# Patient Record
Sex: Female | Born: 1990 | Race: White | Hispanic: No | Marital: Married | Smoking: Former smoker
Health system: Southern US, Community
[De-identification: ages and names within clinical notes are randomized; demographics above are authoritative.]

## PROBLEM LIST (undated history)

## (undated) DIAGNOSIS — B999 Unspecified infectious disease: Secondary | ICD-10-CM

## (undated) DIAGNOSIS — O1490 Unspecified pre-eclampsia, unspecified trimester: Secondary | ICD-10-CM

## (undated) DIAGNOSIS — N83209 Unspecified ovarian cyst, unspecified side: Secondary | ICD-10-CM

## (undated) DIAGNOSIS — T8859XA Other complications of anesthesia, initial encounter: Secondary | ICD-10-CM

## (undated) DIAGNOSIS — Z8489 Family history of other specified conditions: Secondary | ICD-10-CM

## (undated) DIAGNOSIS — O009 Unspecified ectopic pregnancy without intrauterine pregnancy: Secondary | ICD-10-CM

## (undated) DIAGNOSIS — T4145XA Adverse effect of unspecified anesthetic, initial encounter: Secondary | ICD-10-CM

## (undated) HISTORY — PX: LAPAROSCOPY: SHX197

## (undated) HISTORY — PX: KNEE SURGERY: SHX244

## (undated) HISTORY — PX: WISDOM TOOTH EXTRACTION: SHX21

## (undated) HISTORY — PX: HERNIA REPAIR: SHX51

## (undated) HISTORY — DX: Unspecified pre-eclampsia, unspecified trimester: O14.90

---
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
## 2012-11-13 ENCOUNTER — Emergency Department: Payer: Self-pay | Admitting: Emergency Medicine

## 2012-11-13 LAB — COMPREHENSIVE METABOLIC PANEL
Albumin: 3.6 g/dL — ABNORMAL LOW (ref 3.8–5.6)
Alkaline Phosphatase: 105 U/L (ref 82–169)
Anion Gap: 6 — ABNORMAL LOW (ref 7–16)
BUN: 6 mg/dL — ABNORMAL LOW (ref 7–18)
Bilirubin,Total: 0.5 mg/dL (ref 0.2–1.0)
Creatinine: 0.82 mg/dL (ref 0.60–1.30)
EGFR (African American): >60
EGFR (Non-African Amer.): >60
Glucose: 97 mg/dL (ref 65–99)
Osmolality: 269 (ref 275–301)
Potassium: 3.3 mmol/L — ABNORMAL LOW (ref 3.5–5.1)
SGOT(AST): 12 U/L (ref 0–26)
SGPT (ALT): 15 U/L (ref 12–78)

## 2012-11-13 LAB — CBC
HGB: 11.9 g/dL — ABNORMAL LOW (ref 12.0–16.0)
MCH: 29.5 pg (ref 26.0–34.0)
MCHC: 34.1 g/dL (ref 32.0–36.0)
Platelet: 269 10*3/uL (ref 150–440)
RDW: 11.7 % (ref 11.5–14.5)
WBC: 14.4 10*3/uL — ABNORMAL HIGH (ref 3.6–11.0)

## 2012-11-13 LAB — URINALYSIS, COMPLETE
Bilirubin,UR: NEGATIVE
Blood: NEGATIVE
Glucose,UR: NEGATIVE mg/dL (ref 0–75)
Nitrite: NEGATIVE
Ph: 5 (ref 4.5–8.0)
Protein: 30
RBC,UR: 4 /HPF (ref 0–5)
Squamous Epithelial: 8

## 2013-04-24 ENCOUNTER — Ambulatory Visit: Payer: Self-pay | Admitting: Obstetrics and Gynecology

## 2013-04-24 LAB — URINALYSIS, COMPLETE
Bacteria: NEGATIVE
Bilirubin,UR: NEGATIVE
Blood: NEGATIVE
Glucose,UR: NEGATIVE mg/dL (ref 0–75)
Leukocyte Esterase: NEGATIVE
Nitrite: NEGATIVE
Protein: NEGATIVE
Specific Gravity: 1.005 (ref 1.003–1.030)

## 2013-04-24 LAB — CBC
HGB: 14.3 g/dL (ref 12.0–16.0)
MCH: 30.3 pg (ref 26.0–34.0)
MCV: 87 fL (ref 80–100)
Platelet: 212 10*3/uL (ref 150–440)
RBC: 4.73 10*6/uL (ref 3.80–5.20)
WBC: 6.4 10*3/uL (ref 3.6–11.0)

## 2013-05-03 ENCOUNTER — Ambulatory Visit: Payer: Self-pay | Admitting: Obstetrics and Gynecology

## 2013-05-06 LAB — PATHOLOGY REPORT

## 2014-06-02 ENCOUNTER — Ambulatory Visit (INDEPENDENT_AMBULATORY_CARE_PROVIDER_SITE_OTHER): Payer: BC Managed Care – PPO | Admitting: Emergency Medicine

## 2014-06-02 VITALS — BP 124/86 | HR 105 | Temp 97.9°F | Resp 16 | Ht 64.0 in | Wt 124.4 lb

## 2014-06-02 DIAGNOSIS — J209 Acute bronchitis, unspecified: Secondary | ICD-10-CM

## 2014-06-02 DIAGNOSIS — J018 Other acute sinusitis: Secondary | ICD-10-CM

## 2014-06-02 MED ORDER — PSEUDOEPHEDRINE-GUAIFENESIN ER 60-600 MG PO TB12: 1.0000 | ORAL_TABLET | Freq: Two times a day (BID) | ORAL | Status: AC

## 2014-06-02 MED ORDER — AMOXICILLIN-POT CLAVULANATE 875-125 MG PO TABS: 1.0000 | ORAL_TABLET | Freq: Two times a day (BID) | ORAL | Status: DC

## 2014-06-02 MED ORDER — PROMETHAZINE-CODEINE 6.25-10 MG/5ML PO SYRP
5.0000 mL | ORAL_SOLUTION | Freq: Four times a day (QID) | ORAL | Status: DC | PRN
Start: 2014-06-02 — End: 2015-09-29

## 2014-06-02 NOTE — Patient Instructions (Signed)

## 2014-06-02 NOTE — Progress Notes (Signed)
[facility] [address], [place] [id number] [phone]  Date:  06/02/2014   Name:  [name] [name]   DOB:  1990/11/05   MRN:  [id number]  PCP:  No PCP Per Patient    Chief Complaint: Cough, Back Pain and Chest Congestion   History of Present Illness:  [name] [name] is a 23 y.o. very pleasant female patient who presents with the following:  Ill since the weekend with nasal congestion and post nasal drainage Has a cough productive of purulent sputum.  No wheezing or shortness of breath. No nausea or vomiting.  No stool change or rash No fever but is chilled.   Worse now than weekend Works in day care No improvement with over the counter medications or other home remedies.  Denies other complaint or health concern today.    There are no active problems to display for this patient.   History reviewed. No pertinent past medical history.  History reviewed. No pertinent past surgical history.  History  Substance Use Topics  . Smoking status: Current Every Day Smoker -- 0.50 packs/day    Types: Cigarettes  . Smokeless tobacco: Never Used  . Alcohol Use: 0.5 oz/week    1 drink(s) per week    Family History  Problem Relation Age of Onset  . Cancer Maternal Grandmother   . Diabetes Maternal Grandfather   . Heart disease Paternal Grandmother   . Hyperlipidemia Paternal Grandmother   . Stroke Paternal Grandmother   . Heart disease Paternal Grandfather   . Hyperlipidemia Paternal Grandfather     No Known Allergies  Medication list has been reviewed and updated.  No current outpatient prescriptions on file prior to visit.   No current facility-administered medications on file prior to visit.    Review of Systems:  As per HPI, otherwise negative.    Physical Examination: Filed Vitals:   06/02/14 1921  BP: 124/86  Pulse: 105  Temp: 97.9 F (36.6 C)  Resp: 16   Filed Vitals:   06/02/14 1921  Height: 5\' 4"  (1.626 m)  Weight: 124 lb  6 oz (56.416 kg)   Body mass index is 21.34 kg/(m^2). Ideal Body Weight: Weight in (lb) to have BMI = 25: 145.3  GEN: WDWN, NAD, Non-toxic, A & O x 3 HEENT: Atraumatic, Normocephalic. Neck supple. No masses, No LAD. Ears and Nose: No external deformity. CV: RRR, No M/G/R. No JVD. No thrill. No extra heart sounds. PULM: CTA B, no wheezes, crackles, rhonchi. No retractions. No resp. distress. No accessory muscle use. ABD: S, NT, ND, +BS. No rebound. No HSM. EXTR: No c/c/e NEURO Normal gait.  PSYCH: Normally interactive. Conversant. Not depressed or anxious appearing.  Calm demeanor.    Assessment and Plan: Bronchitis Sinusitis Augmentin  mucinex d Phen c cod  Signed,  Phillips OdorJeffery Lex Linhares, MD

## 2014-11-28 NOTE — Op Note (Signed)
PATIENT NAME:  [name], [name] A MR#:  [id number] DATE OF BIRTH:  1990/08/31  DATE OF PROCEDURE:  05/03/2013  PREOPERATIVE DIAGNOSIS: Chronic pelvic pain and a right ovarian cyst.   POSTOPERATIVE DIAGNOSIS: Pelvic pain and endometriosis.   PROCEDURES: Operative laparoscopy, excision and cautery of endometriosis, lysis of adhesions.   ANESTHESIA: General.  SURGEON: Senaida LangeLashawn Weaver-Lee, M.D.   ESTIMATED BLOOD LOSS: Minimal.   OPERATIVE FLUIDS: 100 mL.   COMPLICATIONS: None.   FINDINGS:  1.  Endometriotic lesions in the posterior cul-de-sac, inferior to the uterosacral ligaments bilaterally, and right pelvic sidewall.  2.  Filmy adhesions of the left ovary to the mesosalpinx and left posterior uterus.  3.  Clubbing of the fimbriated ends of both tubes.  4.  Clubbed appearance of the right cornu.  5.  Filmy adhesions of the liver to anterior abdominal wall (Fitz-Hugh-Curtis).  6.  Normal appearing uterus and ovaries.   SPECIMENS: Two biopsies the posterior cul-de-sac.   INDICATIONS: The patient is a [age] year old who presents for diagnostic laparoscopy and right ovarian cystectomy for chronic pelvic pain and an ovarian cyst. Risks, benefits, indications, and alternatives of the procedure were explained and informed consent was obtained.   PROCEDURE: The patient was taken to the operating room with IV fluids running. She was prepped and draped in the usual sterile fashion in [place] stirrups. A speculum was placed inside the vagina. The anterior lip of the cervix was grasped with a single-tooth tenaculum and a Hulka tenaculum was placed for uterine manipulation. Attention was turned to her abdomen where the infraumbilical region was injected with 0.5% Sensorcaine. A 5 mm incision was made and the Veress needle was placed. The abdomen was insufflated with CO2 gas. The Veress needle was removed and the abdomen was entered under direct visualization using a 5 mm Xcel trocar. Two additional trocars  were placed in the patient's abdomen, both 5 mm. Findings were as previously stated. The endometriotic lesions in the posterior cul-de-sac were grasped with a microdissecting laparoscopic tubal and the lesion was biopsied using laparoscopic scissors. This was done twice. The remainder of the visualized endometriotic lesions were cauterized using the Kleppinger. The filmy adhesions between the left ovary and the uterus were lysed using the Kleppinger. The surgical areas from which the biopsies were taken were made hemostatic using Kleppinger cautery of the appendix was visualized and was seen to be normal. Adhesions over the liver to the anterior abdominal wall were not treated. Hemostasis was obtained. The patient tolerated the procedure well. All instrumentation was removed from her abdomen. The skin of the incisions were all closed using Dermabond skin glue. Sponge and instrument counts were correct x 2. The patient was awakened from anesthesia and taken to the recovery room in stable condition.    ____________________________ Sonda PrimesLashawn A. Patton SallesWeaver-Lee, MD law:dp D: 05/03/2013 11:11:58 ET T: 05/03/2013 11:29:42 ET JOB#: [id number]  cc: Flint MelterLashawn A. Patton SallesWeaver-Lee, MD, <Dictator> Janyth ContesLASHAWN A WEAVER LEE MD ELECTRONICALLY SIGNED 05/20/2013 18:17

## 2015-06-05 ENCOUNTER — Encounter: Payer: Self-pay | Admitting: Family Medicine

## 2015-09-29 ENCOUNTER — Ambulatory Visit (INDEPENDENT_AMBULATORY_CARE_PROVIDER_SITE_OTHER): Payer: BLUE CROSS/BLUE SHIELD | Admitting: Family Medicine

## 2015-09-29 ENCOUNTER — Encounter: Payer: Self-pay | Admitting: Family Medicine

## 2015-09-29 VITALS — BP 120/84 | HR 76 | Temp 98.2°F | Resp 14 | Wt 130.8 lb

## 2015-09-29 DIAGNOSIS — H109 Unspecified conjunctivitis: Secondary | ICD-10-CM | POA: Diagnosis not present

## 2015-09-29 DIAGNOSIS — J309 Allergic rhinitis, unspecified: Secondary | ICD-10-CM

## 2015-09-29 DIAGNOSIS — H6980 Other specified disorders of Eustachian tube, unspecified ear: Secondary | ICD-10-CM | POA: Insufficient documentation

## 2015-09-29 DIAGNOSIS — G43909 Migraine, unspecified, not intractable, without status migrainosus: Secondary | ICD-10-CM | POA: Insufficient documentation

## 2015-09-29 DIAGNOSIS — F17200 Nicotine dependence, unspecified, uncomplicated: Secondary | ICD-10-CM | POA: Insufficient documentation

## 2015-09-29 MED ORDER — ERYTHROMYCIN 5 MG/GM OP OINT: 1.0000 "application " | TOPICAL_OINTMENT | Freq: Every day | OPHTHALMIC | Status: DC

## 2015-09-29 NOTE — Patient Instructions (Signed)
Allergic Rhinitis Allergic rhinitis is when the mucous membranes in the nose respond to allergens. Allergens are particles in the air that cause your body to have an allergic reaction. This causes you to release allergic antibodies. Through a chain of events, these eventually cause you to release histamine into the blood stream. Although meant to protect the body, it is this release of histamine that causes your discomfort, such as frequent sneezing, congestion, and an itchy, runny nose.  CAUSES Seasonal allergic rhinitis (hay fever) is caused by pollen allergens that may come from grasses, trees, and weeds. Year-round allergic rhinitis (perennial allergic rhinitis) is caused by allergens such as house dust mites, pet dander, and mold spores. SYMPTOMS  Nasal stuffiness (congestion).  Itchy, runny nose with sneezing and tearing of the eyes. DIAGNOSIS Your health care provider can help you determine the allergen or allergens that trigger your symptoms. If you and your health care provider are unable to determine the allergen, skin or blood testing may be used. Your health care provider will diagnose your condition after taking your health history and performing a physical exam. Your health care provider may assess you for other related conditions, such as asthma, pink eye, or an ear infection. TREATMENT Allergic rhinitis does not have a cure, but it can be controlled by:  Medicines that block allergy symptoms. These may include allergy shots, nasal sprays, and oral antihistamines.  Avoiding the allergen. Hay fever may often be treated with antihistamines in pill or nasal spray forms. Antihistamines block the effects of histamine. There are over-the-counter medicines that may help with nasal congestion and swelling around the eyes. Check with your health care provider before taking or giving this medicine. If avoiding the allergen or the medicine prescribed do not work, there are many new medicines  your health care provider can prescribe. Stronger medicine may be used if initial measures are ineffective. Desensitizing injections can be used if medicine and avoidance does not work. Desensitization is when a patient is given ongoing shots until the body becomes less sensitive to the allergen. Make sure you follow up with your health care provider if problems continue. HOME CARE INSTRUCTIONS It is not possible to completely avoid allergens, but you can reduce your symptoms by taking steps to limit your exposure to them. It helps to know exactly what you are allergic to so that you can avoid your specific triggers. SEEK MEDICAL CARE IF:  You have a fever.  You develop a cough that does not stop easily (persistent).  You have shortness of breath.  You start wheezing.  Symptoms interfere with normal daily activities.   This information is not intended to replace advice given to you by your health care provider. Make sure you discuss any questions you have with your health care provider.   Document Released: 04/19/2001 Document Revised: 08/15/2014 Document Reviewed: 04/01/2013 Elsevier Interactive Patient Education 2016 [employer]. Allergic Conjunctivitis Allergic conjunctivitis is inflammation of the clear membrane that covers the white part of your eye and the inner surface of your eyelid (conjunctiva), and it is caused by allergies. The blood vessels in the conjunctiva become inflamed, and this causes the eye to become red or pink, and it often causes itchiness in the eye. Allergic conjunctivitis cannot be spread by one person to another person (noncontagious). CAUSES This condition is caused by an allergic reaction. Common causes of an allergic reaction (allergens) include:  Dust.  Pollen.  Mold.  Animal dander or secretions. RISK FACTORS This condition is  more likely to develop if you are exposed to high levels of allergens that cause the allergic reaction. This might include  being outdoors when air pollen levels are high or being around animals that you are allergic to. SYMPTOMS Symptoms of this condition may include:  Eye redness.  Tearing of the eyes.  Watery eyes.  Itchy eyes.  Burning feeling in the eyes.  Clear drainage from the eyes.  Swollen eyelids. DIAGNOSIS This condition may be diagnosed by medical history and physical exam. If you have drainage from your eyes, it may be tested to rule out other causes of conjunctivitis. TREATMENT Treatment for this condition often includes medicines. These may be eye drops, ointments, or oral medicines. They may be prescription medicines or over-the-counter medicines. HOME CARE INSTRUCTIONS  Take or apply medicines only as directed by your health care provider.  Do not touch or rub your eyes.  Do not wear contact lenses until the inflammation is gone. Wear glasses instead.  Do not wear eye makeup until the inflammation is gone.  Apply a cool, clean washcloth to your eye for 10-20 minutes, 3-4 times a day.  Try to avoid whatever allergen is causing the allergic reaction. SEEK MEDICAL CARE IF:  Your symptoms get worse.  You have pus draining from your eye.  You have new symptoms.  You have a fever.   This information is not intended to replace advice given to you by your health care provider. Make sure you discuss any questions you have with your health care provider.   Document Released: 10/15/2002 Document Revised: 08/15/2014 Document Reviewed: 05/06/2014 Elsevier Interactive Patient Education [employer]

## 2015-09-29 NOTE — Progress Notes (Signed)
Patient ID: [name] [name], female   DOB: [birth year], 25 y.o.   MRN: [id number]   [name] Female    DOB: 1990/10/02   24 y.o.   MRN: [id number] Visit Date: 09/29/2015  Today's Provider: Dortha Kern, PA   Chief Complaint  Patient presents with  . Conjunctivitis   Subjective:    Conjunctivitis  The current episode started 2 days ago. The onset was sudden. Associated symptoms include rhinorrhea, eye discharge, eye pain (eye burning) and eye redness. Pertinent negatives include no congestion and no sore throat. Associated symptoms comments: Right eye. The eye pain is moderate. The right eye is affected. The eyelid exhibits swelling.   History reviewed. No pertinent past medical history. Patient Active Problem List   Diagnosis Date Noted  . Dysfunction of eustachian tube 09/29/2015  . Compulsive tobacco user syndrome 09/29/2015  . Headache, migraine 09/29/2015   Past Surgical History  Procedure Laterality Date  . Hernia repair    . Wisdom tooth extraction    . Laparoscopy    . Knee surgery Right    Family History  Problem Relation Age of Onset  . Cancer Maternal Grandmother   . Diabetes Maternal Grandfather   . Heart disease Paternal Grandmother   . Hyperlipidemia Paternal Grandmother   . Stroke Paternal Grandmother   . Heart disease Paternal Grandfather   . Hyperlipidemia Paternal Grandfather   . COPD Paternal Grandfather   . Healthy Mother   . Healthy Father      Previous Medications   No medications on file   No Known Allergies  Review of Systems  Constitutional: Negative.   HENT: Positive for rhinorrhea. Negative for congestion and sore throat.   Eyes: Positive for pain (eye burning), discharge and redness.  Respiratory: Negative.   Cardiovascular: Negative.   Gastrointestinal: Negative.   Endocrine: Negative.   Genitourinary: Negative.   Musculoskeletal: Negative.   Skin: Negative.   Allergic/Immunologic: Negative.   Neurological: Negative.     Hematological: Negative.   Psychiatric/Behavioral: Negative.     Social History  Substance Use Topics  . Smoking status: Current Every Day Smoker -- 0.50 packs/day    Types: Cigarettes  . Smokeless tobacco: Never Used  . Alcohol Use: 0.5 oz/week    1 Standard drinks or equivalent per week   Objective:   BP 120/84 mmHg  Pulse 76  Temp(Src) 98.2 F (36.8 C) (Oral)  Resp 14  Wt 130 lb 12.8 oz (59.33 kg)  LMP 09/23/2015  Physical Exam  Constitutional: She is oriented to person, place, and time. She appears well-developed and well-nourished. No distress.  HENT:  Head: Normocephalic and atraumatic.  Right Ear: Hearing and external ear normal.  Left Ear: Hearing and external ear normal.  Nose: Nose normal.  Mouth/Throat: Oropharynx is clear and moist.  Eyes: EOM and lids are normal. Right eye exhibits no discharge. Left eye exhibits no discharge. No scleral icterus.  Some crusting of the right eye in the morning. Clear tearing during the day. Slight erythema to the lateral sclera of the right eye. Upper and lower lids are puffy and slightly sore on the right.  Neck: Neck supple.  Pulmonary/Chest: Effort normal. No respiratory distress.  Musculoskeletal: Normal range of motion.  Lymphadenopathy:    She has no cervical adenopathy.  Neurological: She is alert and oriented to person, place, and time.  Skin: Skin is intact. No lesion and no rash noted.  Psychiatric: She has a normal mood and affect. Her speech is normal  and behavior is normal. Thought content normal.      Assessment & Plan:     1. Conjunctivitis of right eye Onset over the past 2 days with irritation and puffy right eyelids. Will treat with Erythromycin Ophthalmic Ointment and use warm compresses as needed. Wash hands frequently and recheck if no better in 3-4 days. - erythromycin [facility]) ophthalmic ointment; Place 1 application into the right eye at bedtime. 1/2" ribbon inside the right lower eyelid.  Dispense:  3.5 g; Refill: 0  2. Allergic rhinitis, unspecified allergic rhinitis type Some sneezing, clear discharge from right eye and rhinorrhea. May use Claritin prn and add Flonase if needed.

## 2015-10-12 ENCOUNTER — Ambulatory Visit (INDEPENDENT_AMBULATORY_CARE_PROVIDER_SITE_OTHER): Payer: BLUE CROSS/BLUE SHIELD | Admitting: Physician Assistant

## 2015-10-12 ENCOUNTER — Encounter: Payer: Self-pay | Admitting: Physician Assistant

## 2015-10-12 VITALS — BP 120/70 | HR 95 | Temp 97.7°F | Resp 16 | Wt 132.0 lb

## 2015-10-12 DIAGNOSIS — H66003 Acute suppurative otitis media without spontaneous rupture of ear drum, bilateral: Secondary | ICD-10-CM

## 2015-10-12 MED ORDER — AMOXICILLIN 875 MG PO TABS: 875.0000 mg | ORAL_TABLET | Freq: Two times a day (BID) | ORAL | Status: DC

## 2015-10-12 NOTE — Progress Notes (Signed)
[name] Female    DOB: 1991/05/11   24 y.o.   MRN: [id number] Visit Date: 10/12/2015  Today's Provider: Margaretann Loveless, PA-C   Chief Complaint  Patient presents with  . Otalgia   Subjective:    Otalgia  There is pain in both ears. This is a new problem. There has been no fever. Associated symptoms include ear discharge (from both ears), hearing loss (from the right ear) and a sore throat (at night only). Pertinent negatives include no abdominal pain, coughing, headaches or vomiting. Associated symptoms comments: Left ear hurts and right ear feels congested. She has tried ear drops (OTC ear drops) for the symptoms. The treatment provided no relief.       No Known Allergies Previous Medications   No medications on file    Review of Systems  Constitutional: Negative for fever and chills.  HENT: Positive for ear discharge (from both ears), ear pain, hearing loss (from the right ear), postnasal drip (only at night) and sore throat (at night only).   Respiratory: Negative for cough.   Cardiovascular: Negative for chest pain.  Gastrointestinal: Negative for nausea, vomiting and abdominal pain.  Neurological: Negative for dizziness and headaches.    Social History  Substance Use Topics  . Smoking status: Current Every Day Smoker -- 0.50 packs/day    Types: Cigarettes  . Smokeless tobacco: Never Used  . Alcohol Use: 0.5 oz/week    1 Standard drinks or equivalent per week   Objective:   BP 120/70 mmHg  Pulse 95  Temp(Src) 97.7 F (36.5 C) (Oral)  Resp 16  Wt 132 lb (59.875 kg)  LMP 09/23/2015  Physical Exam  Constitutional: She appears well-developed and well-nourished. No distress.  HENT:  Head: Normocephalic and atraumatic.  Right Ear: Hearing and ear canal normal. There is drainage and tenderness. No mastoid tenderness. Tympanic membrane is erythematous and bulging. Tympanic membrane is not perforated. A middle ear effusion is present.  Left  Ear: Hearing and ear canal normal. There is tenderness. No drainage. No mastoid tenderness. Tympanic membrane is erythematous and bulging. A middle ear effusion is present.  Nose: Nose normal. Right sinus exhibits no maxillary sinus tenderness and no frontal sinus tenderness. Left sinus exhibits no maxillary sinus tenderness and no frontal sinus tenderness.  Mouth/Throat: Uvula is midline, oropharynx is clear and moist and mucous membranes are normal. No oropharyngeal exudate, posterior oropharyngeal edema or posterior oropharyngeal erythema.  Eyes: Conjunctivae are normal. Pupils are equal, round, and reactive to light. Right eye exhibits no discharge. Left eye exhibits no discharge. No scleral icterus.  Neck: Normal range of motion. Neck supple. No tracheal deviation present. No thyromegaly present.  Cardiovascular: Normal rate, regular rhythm and normal heart sounds.  Exam reveals no gallop and no friction rub.   No murmur heard. Pulmonary/Chest: Effort normal and breath sounds normal. No stridor. No respiratory distress. She has no wheezes. She has no rales.  Lymphadenopathy:    She has no cervical adenopathy.  Skin: Skin is warm and dry. She is not diaphoretic.  Vitals reviewed.       Assessment & Plan:     1. Acute suppurative otitis media of both ears without spontaneous rupture of tympanic membranes, recurrence not specified Worsening otitis media. Does have history of otitis media of right ear. Will treat with amoxicillin as below. May continue motrin and tylenol intermittently for pain and fevers. She is to call the office if symptoms fail to  improve or worsen in the next 5-7 days.  - amoxicillin (AMOXIL) 875 MG tablet; Take 1 tablet (875 mg total) by mouth 2 (two) times daily.  Dispense: 20 tablet; Refill: 0       Margaretann LovelessJennifer M Burnette, PA-C  [facility]

## 2015-10-12 NOTE — Patient Instructions (Signed)

## 2015-10-14 ENCOUNTER — Telehealth: Payer: Self-pay | Admitting: Physician Assistant

## 2015-10-14 NOTE — Telephone Encounter (Signed)
Pt's mom [name] [name] stated that pt's ears are worse. Pt's ear are hurting more and stopped up, she feels like she can not hear. Mom wasn't sure if pt needed to come back for an OV or if she should try another medication. Please advise. Thanks TNP

## 2015-10-14 NOTE — Telephone Encounter (Signed)
Patient advised as directed below. Patient verbalized understanding.  

## 2015-10-14 NOTE — Telephone Encounter (Signed)
Add sudafed for decongestion. Call if no improvement by Friday.

## 2016-02-10 ENCOUNTER — Encounter (HOSPITAL_COMMUNITY): Payer: Self-pay | Admitting: *Deleted

## 2016-02-10 ENCOUNTER — Inpatient Hospital Stay (HOSPITAL_COMMUNITY)
Admission: AD | Admit: 2016-02-10 | Discharge: 2016-02-10 | Disposition: A | Discharge status: Home / Self Care | Payer: BLUE CROSS/BLUE SHIELD | Source: Ambulatory Visit | Attending: Obstetrics & Gynecology | Admitting: Obstetrics & Gynecology

## 2016-02-10 DIAGNOSIS — O009 Unspecified ectopic pregnancy without intrauterine pregnancy: Secondary | ICD-10-CM

## 2016-02-10 DIAGNOSIS — O001 Tubal pregnancy without intrauterine pregnancy: Secondary | ICD-10-CM | POA: Insufficient documentation

## 2016-02-10 HISTORY — DX: Unspecified infectious disease: B99.9

## 2016-02-10 HISTORY — DX: Unspecified ovarian cyst, unspecified side: N83.209

## 2016-02-10 LAB — CBC WITH DIFFERENTIAL/PLATELET
BASOS ABS: 0 10*3/uL (ref 0.0–0.1)
BASOS PCT: 0 %
EOS ABS: 0.1 10*3/uL (ref 0.0–0.7)
EOS PCT: 1 %
HCT: 40 % (ref 36.0–46.0)
Hemoglobin: 14.5 g/dL (ref 12.0–15.0)
Lymphocytes Relative: 19 %
Lymphs Abs: 1.7 10*3/uL (ref 0.7–4.0)
MCH: 31.1 pg (ref 26.0–34.0)
MCHC: 36.3 g/dL — ABNORMAL HIGH (ref 30.0–36.0)
MCV: 85.8 fL (ref 78.0–100.0)
Monocytes Absolute: 0.3 10*3/uL (ref 0.1–1.0)
Monocytes Relative: 3 %
Neutro Abs: 7.1 10*3/uL (ref 1.7–7.7)
Neutrophils Relative %: 77 %
PLATELETS: 220 10*3/uL (ref 150–400)
RBC: 4.66 MIL/uL (ref 3.87–5.11)
RDW: 12.2 % (ref 11.5–15.5)
WBC: 9.2 10*3/uL (ref 4.0–10.5)

## 2016-02-10 LAB — CREATININE, SERUM
CREATININE: 0.58 mg/dL (ref 0.44–1.00)
GFR calc Af Amer: >60 mL/min (ref >60)

## 2016-02-10 LAB — AST: AST: 20 U/L (ref 15–41)

## 2016-02-10 LAB — TYPE AND SCREEN
ABO/RH(D): B POS
Antibody Screen: NEGATIVE

## 2016-02-10 LAB — HCG, QUANTITATIVE, PREGNANCY: HCG, BETA CHAIN, QUANT, S: 5973 m[IU]/mL — AB (ref <5)

## 2016-02-10 LAB — ABO/RH: ABO/RH(D): B POS

## 2016-02-10 LAB — BUN: BUN: 9 mg/dL (ref 6–20)

## 2016-02-10 MED ORDER — METHOTREXATE INJECTION FOR WOMEN'S HOSPITAL: 50.0000 mg/m2 | Freq: Once | INTRAMUSCULAR | Status: DC

## 2016-02-10 MED ORDER — METHOTREXATE INJECTION FOR WOMEN'S HOSPITAL
50.0000 mg/m2 | Freq: Once | INTRAMUSCULAR | Status: AC
  Administered 2016-02-10: 80 mg via INTRAMUSCULAR
  Filled 2016-02-10: qty 1.6

## 2016-02-10 NOTE — MAU Note (Signed)
Pt sent from office for Methotrexate injection for an ectopic pregnancy.

## 2016-02-10 NOTE — Discharge Instructions (Signed)
[facility] We will be rechecking your pregnancy hormone level. The dates to return are Saturday, July 8 and Tuesday July 11.  Methotrexate Treatment for an Ectopic Pregnancy, Care After Refer to this sheet in the next few weeks. These instructions provide you with information on caring for yourself after your procedure. Your health care provider may also give you more specific instructions. Your treatment has been planned according to current medical practices, but problems sometimes occur. Call your health care provider if you have any problems or questions after your procedure. WHAT TO EXPECT AFTER THE PROCEDURE You may have some abdominal cramping, vaginal bleeding, and fatigue in the first few days after taking methotrexate. Some other possible side effects of methotrexate include:  Nausea.  Vomiting.  Diarrhea.  Mouth sores.  Swelling or irritation of the lining of your lungs (pneumonitis).  Liver damage.  Hair loss. HOME CARE INSTRUCTIONS  After you have received the methotrexate medicine, you need to be careful of your activities and watch your condition for several weeks. It may take 1 week before your hormone levels return to normal.  Keep all follow-up appointments as directed by your health care provider.  Avoid traveling too far away from your health care provider.  Do not have sexual intercourse until your health care provider says it is safe to do so.  You may resume your usual diet.  Limit strenuous activity.  Do not take folic acid, prenatal vitamins, or other vitamins that contain folic acid.  Do not take aspirin, ibuprofen, or naproxen (nonsteroidal anti-inflammatory drugs [NSAIDs]).  Do not drink alcohol. SEEK MEDICAL CARE IF:   You cannot control your nausea and vomiting.  You cannot control your diarrhea.  You have sores in your mouth and want treatment.  You need pain medicine for your abdominal pain.  You have a  rash.  You are having a reaction to the medicine. SEEK IMMEDIATE MEDICAL CARE IF:   You have increasing abdominal or pelvic pain.  You notice increased bleeding.  You feel light-headed, or you faint.  You have shortness of breath.  Your heart rate increases.  You have a cough.  You have chills.  You have a fever.   This information is not intended to replace advice given to you by your health care provider. Make sure you discuss any questions you have with your health care provider.   Document Released: 07/14/2011 Document Revised: 07/30/2013 Document Reviewed: 05/13/2013 Elsevier Interactive Patient Education [employer]

## 2016-02-10 NOTE — MAU Note (Signed)
Had normal period in June.  +HPT on Mon 7/3, again on 7/4.  +today at the office.  Started cramping on Friday 6/30. US in office today- dx with ectopic preg.  Has not had blood work done.

## 2016-02-13 ENCOUNTER — Inpatient Hospital Stay (HOSPITAL_COMMUNITY)
Admission: AD | Admit: 2016-02-13 | Discharge: 2016-02-13 | Disposition: A | Discharge status: Home / Self Care | Payer: BLUE CROSS/BLUE SHIELD | Source: Ambulatory Visit | Attending: Obstetrics and Gynecology | Admitting: Obstetrics and Gynecology

## 2016-02-13 ENCOUNTER — Encounter (HOSPITAL_COMMUNITY): Payer: Self-pay | Admitting: Advanced Practice Midwife

## 2016-02-13 DIAGNOSIS — O009 Unspecified ectopic pregnancy without intrauterine pregnancy: Secondary | ICD-10-CM | POA: Insufficient documentation

## 2016-02-13 LAB — HCG, QUANTITATIVE, PREGNANCY: hCG, Beta Chain, Quant, S: 7730 m[IU]/mL — ABNORMAL HIGH (ref <5)

## 2016-02-13 MED ORDER — METHOTREXATE INJECTION FOR WOMEN'S HOSPITAL
50.0000 mg/m2 | Freq: Once | INTRAMUSCULAR | Status: AC
  Administered 2016-02-13: 80 mg via INTRAMUSCULAR
  Filled 2016-02-13: qty 1.6

## 2016-02-13 MED ORDER — TRAMADOL HCL 50 MG PO TABS: 50.0000 mg | ORAL_TABLET | Freq: Four times a day (QID) | ORAL | Status: DC | PRN

## 2016-02-13 NOTE — MAU Note (Signed)
Patient states had small amount of pain yesterday none today, denies vaginal bleeding, Day 4 s/p Methotrexate

## 2016-02-13 NOTE — MAU Provider Note (Signed)
History    First Provider Initiated Contact with Patient 02/13/16 1142      Chief Complaint:  Ectopic Pregnancy   [name] [name] is  25 y.o. female, 4 days S/P MTX for ectopic pregnancy. Patient's last menstrual period was 01/27/2016.Marland Kitchen. Patient is here for follow up of quantitative HCG.  She is 2.[redacted] weeks gestation  by LMP.    Since her last visit, the patient is without new complaint.     ROS Abdomin Pain: None (mild pain yesterday) Vaginal bleeding: none now.   Passage of clots or tissue: None Dizziness: None  Her previous Quantitative HCG values are:  Results for [name] [name] (MRN [id number]) as of 02/13/2016 11:36  Ref. Range 02/10/2016 14:12  HCG, Beta Chain, Quant, S Latest Ref Range: <5 mIU/mL 5973 (H)    Physical Exam   BP 131/85 mmHg  Pulse 107  Temp(Src) 98.1 F (36.7 C)  Resp 18  Ht 5\' 5"  (1.651 m)  Wt 126 lb (57.153 kg)  BMI 20.97 kg/m2  LMP 01/27/2016 Constitutional: Well-nourished female in no apparent distress. No pallor Neuro: Alert and oriented 4 Cardiovascular: Normal rate Respiratory: Normal effort and rate Abd: Soft, NT Gynecological Exam: examination not indicated  Labs: Results for orders placed or performed during the hospital encounter of 02/13/16 (from the past 24 hour(s))  hCG, quantitative, pregnancy   Collection Time: 02/13/16 10:18 AM  Result Value Ref Range   hCG, Beta Chain, Quant, S 7730 (H) <5 mIU/mL    Ultrasound Studies:   No results found.  MAU course/MDM: Quantitative hCG ordered  Ectopic pregnancy with rise in Quant, likely normal percent rise for day #4 MTX, but concerning for being high hCG for MTX Tx. Hemodynamically stable w/out evidence of rupture. Per Dr. Renaldo FiddlerAdkins, second dose of MTX given.   Assessment: Day #4 MTX for ectopic pregnancy w/ rise in Quant MTX #2 given.   Plan: Discharge home in stable condition per consult w/ Dr. Renaldo FiddlerAdkins. Ectopic precautions Avoid all sources of Folic acid and NSAIDS.   Follow-up  Information    Follow up with MORRIS, MEGAN, DO On 02/15/2016.   Specialty:  Obstetrics and Gynecology   Why:  For repeat blood work   Contact information:   [address], [address], [address] [place] [address] [phone]       Follow [facility] with [facility].   Why:  As needed in emergencies   Contact information:   [address] [id number] mc [place] [id number] [phone]       Medication List    STOP taking these medications        amoxicillin 875 MG tablet  Commonly known as:  AMOXIL      TAKE these medications        traMADol 50 MG tablet  Commonly known as:  ULTRAM  Take 1-2 tablets (50-100 mg total) by mouth every 6 (six) hours as needed for severe pain.        Dorathy KinsmanVirginia Jovahn Breit, CNM 02/13/2016, 12:18 PM  2/3

## 2016-02-13 NOTE — Discharge Instructions (Signed)
Methotrexate Treatment for an Ectopic Pregnancy, Care After °Refer to this sheet in the next few weeks. These instructions provide you with information on caring for yourself after your procedure. Your health care provider may also give you more specific instructions. Your treatment has been planned according to current medical practices, but problems sometimes occur. Call your health care provider if you have any problems or questions after your procedure. °WHAT TO EXPECT AFTER THE PROCEDURE °You may have some abdominal cramping, vaginal bleeding, and fatigue in the first few days after taking methotrexate. Some other possible side effects of methotrexate include: °· Nausea. °· Vomiting. °· Diarrhea. °· Mouth sores. °· Swelling or irritation of the lining of your lungs (pneumonitis). °· Liver damage. °· Hair loss. °HOME CARE INSTRUCTIONS  °After you have received the methotrexate medicine, you need to be careful of your activities and watch your condition for several weeks. It may take 1 week before your hormone levels return to normal. °· Keep all follow-up appointments as directed by your health care provider. °· Avoid traveling too far away from your health care provider. °· Do not have sexual intercourse until your health care provider says it is safe to do so. °· You may resume your usual diet. °· Limit strenuous activity. °· Do not take folic acid, prenatal vitamins, or other vitamins that contain folic acid. °· Do not take aspirin, ibuprofen, or naproxen (nonsteroidal anti-inflammatory drugs [NSAIDs]). °· Do not drink alcohol. °SEEK MEDICAL CARE IF:  °· You cannot control your nausea and vomiting. °· You cannot control your diarrhea. °· You have sores in your mouth and want treatment. °· You need pain medicine for your abdominal pain. °· You have a rash. °· You are having a reaction to the medicine. °SEEK IMMEDIATE MEDICAL CARE IF:  °· You have increasing abdominal or pelvic pain. °· You notice increased  bleeding. °· You feel light-headed, or you faint. °· You have shortness of breath. °· Your heart rate increases. °· You have a cough. °· You have chills. °· You have a fever. °  °This information is not intended to replace advice given to you by your health care provider. Make sure you discuss any questions you have with your health care provider. °  °Document Released: 07/14/2011 Document Revised: 07/30/2013 Document Reviewed: 05/13/2013 °Elsevier Interactive Patient Education ©2016 [employer] Inc[employer] ° °

## 2016-02-16 ENCOUNTER — Inpatient Hospital Stay (HOSPITAL_COMMUNITY)
Admission: AD | Admit: 2016-02-16 | Discharge: 2016-02-16 | Disposition: A | Discharge status: Home / Self Care | Payer: BLUE CROSS/BLUE SHIELD | Source: Ambulatory Visit | Attending: Obstetrics & Gynecology | Admitting: Obstetrics & Gynecology

## 2016-02-16 DIAGNOSIS — O009 Unspecified ectopic pregnancy without intrauterine pregnancy: Secondary | ICD-10-CM

## 2016-02-16 LAB — HCG, QUANTITATIVE, PREGNANCY: hCG, Beta Chain, Quant, S: 6438 m[IU]/mL — ABNORMAL HIGH (ref <5)

## 2016-02-16 NOTE — MAU Note (Signed)
Pt presents for follow up BHCG. Denies bleeding. Reports pain 2/10.

## 2016-02-16 NOTE — Discharge Instructions (Signed)
Methotrexate Treatment for an Ectopic Pregnancy, Care After °Refer to this sheet in the next few weeks. These instructions provide you with information on caring for yourself after your procedure. Your health care provider may also give you more specific instructions. Your treatment has been planned according to current medical practices, but problems sometimes occur. Call your health care provider if you have any problems or questions after your procedure. °WHAT TO EXPECT AFTER THE PROCEDURE °You may have some abdominal cramping, vaginal bleeding, and fatigue in the first few days after taking methotrexate. Some other possible side effects of methotrexate include: °· Nausea. °· Vomiting. °· Diarrhea. °· Mouth sores. °· Swelling or irritation of the lining of your lungs (pneumonitis). °· Liver damage. °· Hair loss. °HOME CARE INSTRUCTIONS  °After you have received the methotrexate medicine, you need to be careful of your activities and watch your condition for several weeks. It may take 1 week before your hormone levels return to normal. °· Keep all follow-up appointments as directed by your health care provider. °· Avoid traveling too far away from your health care provider. °· Do not have sexual intercourse until your health care provider says it is safe to do so. °· You may resume your usual diet. °· Limit strenuous activity. °· Do not take folic acid, prenatal vitamins, or other vitamins that contain folic acid. °· Do not take aspirin, ibuprofen, or naproxen (nonsteroidal anti-inflammatory drugs [NSAIDs]). °· Do not drink alcohol. °SEEK MEDICAL CARE IF:  °· You cannot control your nausea and vomiting. °· You cannot control your diarrhea. °· You have sores in your mouth and want treatment. °· You need pain medicine for your abdominal pain. °· You have a rash. °· You are having a reaction to the medicine. °SEEK IMMEDIATE MEDICAL CARE IF:  °· You have increasing abdominal or pelvic pain. °· You notice increased  bleeding. °· You feel light-headed, or you faint. °· You have shortness of breath. °· Your heart rate increases. °· You have a cough. °· You have chills. °· You have a fever. °  °This information is not intended to replace advice given to you by your health care provider. Make sure you discuss any questions you have with your health care provider. °  °Document Released: 07/14/2011 Document Revised: 07/30/2013 Document Reviewed: 05/13/2013 °Elsevier Interactive Patient Education ©2016 [employer] Inc[employer] ° °

## 2016-02-16 NOTE — MAU Provider Note (Signed)
Ms. [name] [name]  is a 25 y.o. G1P0 at 5963w6d who presents to MAU today for follow-up quant hCG on day #7 after MTX. The patient states mild intermittent lower abdominal pain currently rated at 2/10. She denies bleeding or fever. She received MTX on 02/10/16 with hCG of 5973 and on day #4 hCG was 7730.   BP 121/66 mmHg  Pulse 78  Temp(Src) 98.3 F (36.8 C) (Oral)  Resp 18  LMP 01/27/2016  CONSTITUTIONAL: Well-developed, well-nourished female in no acute distress.  ENT: External right and left ear normal.  EYES: EOM intact, conjunctivae normal.  MUSCULOSKELETAL: Normal range of motion.  CARDIOVASCULAR: Regular heart rate RESPIRATORY: Normal effort NEUROLOGICAL: Alert and oriented to person, place, and time.  SKIN: Skin is warm and dry. No rash noted. Not diaphoretic. No erythema. No pallor. PSYCH: Normal mood and affect. Normal behavior. Normal judgment and thought content.  Results for [name], [name] (MRN [id number]) as of 02/16/2016 22:50  Ref. Range 02/10/2016 14:12 02/10/2016 14:12 02/13/2016 10:18 02/16/2016 19:30  HCG, Beta Chain, Quant, S Latest Ref Range: <5 mIU/mL 5973 (H)  7730 (H) 6438 (H)   MDM Discussed patient with Dr. Langston MaskerMorris. > 15% decline in hCG noted today. Ok to follow-up weekly in the office.   A: Ectopic pregnancy S/P MTX Appropriate decline in quant hCG on day #7 after MTX  P: Discharge home Ectopic precautions discussed Patient advised to call the office for an appointment in 1 week for repeat labs and continued management after MTX Patient may return to MAU as needed or if her condition were to change or worsen   Marny LowensteinJulie N Wenzel, PA-C 02/16/2016 10:49 PM

## 2016-02-19 ENCOUNTER — Inpatient Hospital Stay (HOSPITAL_COMMUNITY): Payer: BLUE CROSS/BLUE SHIELD | Admitting: Anesthesiology

## 2016-02-19 ENCOUNTER — Ambulatory Visit (HOSPITAL_COMMUNITY)
Admission: AD | Admit: 2016-02-19 | Discharge: 2016-02-19 | Disposition: A | Discharge status: Home / Self Care | Payer: BLUE CROSS/BLUE SHIELD | Source: Ambulatory Visit | Attending: Obstetrics & Gynecology | Admitting: Obstetrics & Gynecology

## 2016-02-19 ENCOUNTER — Inpatient Hospital Stay (HOSPITAL_COMMUNITY): Payer: BLUE CROSS/BLUE SHIELD

## 2016-02-19 ENCOUNTER — Encounter (HOSPITAL_COMMUNITY): Payer: Self-pay | Admitting: *Deleted

## 2016-02-19 ENCOUNTER — Encounter (HOSPITAL_COMMUNITY)
Admission: AD | Disposition: A | Discharge status: Home / Self Care | Payer: Self-pay | Source: Ambulatory Visit | Attending: Obstetrics & Gynecology

## 2016-02-19 DIAGNOSIS — O26899 Other specified pregnancy related conditions, unspecified trimester: Secondary | ICD-10-CM

## 2016-02-19 DIAGNOSIS — O009 Unspecified ectopic pregnancy without intrauterine pregnancy: Secondary | ICD-10-CM

## 2016-02-19 DIAGNOSIS — F17210 Nicotine dependence, cigarettes, uncomplicated: Secondary | ICD-10-CM | POA: Diagnosis not present

## 2016-02-19 DIAGNOSIS — R109 Unspecified abdominal pain: Secondary | ICD-10-CM

## 2016-02-19 DIAGNOSIS — O001 Tubal pregnancy without intrauterine pregnancy: Secondary | ICD-10-CM | POA: Insufficient documentation

## 2016-02-19 HISTORY — PX: LAPAROSCOPY: SHX197

## 2016-02-19 LAB — CBC
HCT: 34.3 % — ABNORMAL LOW (ref 36.0–46.0)
Hemoglobin: 12.1 g/dL (ref 12.0–15.0)
MCH: 30.1 pg (ref 26.0–34.0)
MCHC: 35.3 g/dL (ref 30.0–36.0)
MCV: 85.3 fL (ref 78.0–100.0)
PLATELETS: 162 10*3/uL (ref 150–400)
RBC: 4.02 MIL/uL (ref 3.87–5.11)
RDW: 12 % (ref 11.5–15.5)
WBC: 5.7 10*3/uL (ref 4.0–10.5)

## 2016-02-19 LAB — TYPE AND SCREEN
ABO/RH(D): B POS
ANTIBODY SCREEN: NEGATIVE

## 2016-02-19 LAB — HCG, QUANTITATIVE, PREGNANCY: HCG, BETA CHAIN, QUANT, S: 4233 m[IU]/mL — AB (ref <5)

## 2016-02-19 SURGERY — LAPAROSCOPY, DIAGNOSTIC
Anesthesia: General

## 2016-02-19 MED ORDER — LIDOCAINE HCL (CARDIAC) 20 MG/ML IV SOLN
INTRAVENOUS | Status: AC
  Filled 2016-02-19: qty 5

## 2016-02-19 MED ORDER — FENTANYL CITRATE (PF) 100 MCG/2ML IJ SOLN
INTRAMUSCULAR | Status: DC | PRN
  Administered 2016-02-19: 50 ug via INTRAVENOUS
  Administered 2016-02-19: 150 ug via INTRAVENOUS
  Administered 2016-02-19: 50 ug via INTRAVENOUS

## 2016-02-19 MED ORDER — BUPIVACAINE HCL (PF) 0.25 % IJ SOLN
INTRAMUSCULAR | Status: DC | PRN
  Administered 2016-02-19: 6 mL

## 2016-02-19 MED ORDER — SUGAMMADEX SODIUM 200 MG/2ML IV SOLN
INTRAVENOUS | Status: AC
  Filled 2016-02-19: qty 2

## 2016-02-19 MED ORDER — LACTATED RINGERS IR SOLN
Status: DC | PRN
  Administered 2016-02-19: 3000 mL

## 2016-02-19 MED ORDER — MEPERIDINE HCL 25 MG/ML IJ SOLN: 6.2500 mg | INTRAMUSCULAR | Status: DC | PRN

## 2016-02-19 MED ORDER — HYDROMORPHONE HCL 1 MG/ML IJ SOLN: 0.2500 mg | INTRAMUSCULAR | Status: DC | PRN

## 2016-02-19 MED ORDER — KETOROLAC TROMETHAMINE 30 MG/ML IJ SOLN
INTRAMUSCULAR | Status: DC | PRN
  Administered 2016-02-19: 30 mg via INTRAVENOUS

## 2016-02-19 MED ORDER — HYDROMORPHONE HCL 1 MG/ML IJ SOLN
1.0000 mg | Freq: Once | INTRAMUSCULAR | Status: AC
  Administered 2016-02-19: 1 mg via INTRAVENOUS
  Filled 2016-02-19: qty 1

## 2016-02-19 MED ORDER — KETOROLAC TROMETHAMINE 30 MG/ML IJ SOLN
INTRAMUSCULAR | Status: AC
  Filled 2016-02-19: qty 1

## 2016-02-19 MED ORDER — DEXAMETHASONE SODIUM PHOSPHATE 4 MG/ML IJ SOLN
INTRAMUSCULAR | Status: DC | PRN
  Administered 2016-02-19: 10 mg via INTRAVENOUS

## 2016-02-19 MED ORDER — SUCCINYLCHOLINE CHLORIDE 20 MG/ML IJ SOLN
INTRAMUSCULAR | Status: DC | PRN
  Administered 2016-02-19: 120 mg via INTRAVENOUS

## 2016-02-19 MED ORDER — PROPOFOL 10 MG/ML IV BOLUS
INTRAVENOUS | Status: DC | PRN
  Administered 2016-02-19: 200 mg via INTRAVENOUS

## 2016-02-19 MED ORDER — SCOPOLAMINE 1 MG/3DAYS TD PT72
MEDICATED_PATCH | TRANSDERMAL | Status: DC | PRN
  Administered 2016-02-19: 1 via TRANSDERMAL

## 2016-02-19 MED ORDER — SUGAMMADEX SODIUM 200 MG/2ML IV SOLN
INTRAVENOUS | Status: DC | PRN
  Administered 2016-02-19: 115 mg via INTRAVENOUS

## 2016-02-19 MED ORDER — FENTANYL CITRATE (PF) 250 MCG/5ML IJ SOLN
INTRAMUSCULAR | Status: AC
  Filled 2016-02-19: qty 5

## 2016-02-19 MED ORDER — DEXAMETHASONE SODIUM PHOSPHATE 10 MG/ML IJ SOLN
INTRAMUSCULAR | Status: AC
  Filled 2016-02-19: qty 1

## 2016-02-19 MED ORDER — LIDOCAINE HCL (CARDIAC) 20 MG/ML IV SOLN
INTRAVENOUS | Status: DC | PRN
  Administered 2016-02-19: 100 mg via INTRAVENOUS

## 2016-02-19 MED ORDER — ONDANSETRON HCL 4 MG/2ML IJ SOLN
INTRAMUSCULAR | Status: AC
  Filled 2016-02-19: qty 2

## 2016-02-19 MED ORDER — MIDAZOLAM HCL 2 MG/2ML IJ SOLN
INTRAMUSCULAR | Status: AC
  Filled 2016-02-19: qty 2

## 2016-02-19 MED ORDER — SCOPOLAMINE 1 MG/3DAYS TD PT72
MEDICATED_PATCH | TRANSDERMAL | Status: AC
  Filled 2016-02-19: qty 1

## 2016-02-19 MED ORDER — MIDAZOLAM HCL 2 MG/2ML IJ SOLN: 0.5000 mg | Freq: Once | INTRAMUSCULAR | Status: DC | PRN

## 2016-02-19 MED ORDER — LACTATED RINGERS IV SOLN
INTRAVENOUS | Status: DC
  Administered 2016-02-19 (×2): via INTRAVENOUS

## 2016-02-19 MED ORDER — ONDANSETRON HCL 4 MG/2ML IJ SOLN
INTRAMUSCULAR | Status: DC | PRN
  Administered 2016-02-19: 4 mg via INTRAVENOUS

## 2016-02-19 MED ORDER — ROCURONIUM BROMIDE 100 MG/10ML IV SOLN
INTRAVENOUS | Status: DC | PRN
  Administered 2016-02-19: 30 mg via INTRAVENOUS

## 2016-02-19 MED ORDER — PROMETHAZINE HCL 25 MG/ML IJ SOLN: 6.2500 mg | INTRAMUSCULAR | Status: DC | PRN

## 2016-02-19 MED ORDER — ROCURONIUM BROMIDE 100 MG/10ML IV SOLN
INTRAVENOUS | Status: AC
  Filled 2016-02-19: qty 1

## 2016-02-19 MED ORDER — MIDAZOLAM HCL 2 MG/2ML IJ SOLN
INTRAMUSCULAR | Status: DC | PRN
  Administered 2016-02-19: 2 mg via INTRAVENOUS

## 2016-02-19 MED ORDER — PROPOFOL 10 MG/ML IV BOLUS
INTRAVENOUS | Status: AC
  Filled 2016-02-19: qty 20

## 2016-02-19 SURGICAL SUPPLY — 32 items
BARRIER ADHS 3X4 INTERCEED (GAUZE/BANDAGES/DRESSINGS) IMPLANT
BENZOIN TINCTURE PRP APPL 2/3 (GAUZE/BANDAGES/DRESSINGS) IMPLANT
BRR ADH 4X3 ABS CNTRL BYND (GAUZE/BANDAGES/DRESSINGS)
CABLE HIGH FREQUENCY MONO STRZ (ELECTRODE) IMPLANT
CATH ROBINSON RED A/P 16FR (CATHETERS) ×2 IMPLANT
CLOTH BEACON ORANGE TIMEOUT ST (SAFETY) ×2 IMPLANT
DRSG COVADERM PLUS 2X2 (GAUZE/BANDAGES/DRESSINGS) IMPLANT
DRSG OPSITE POSTOP 3X4 (GAUZE/BANDAGES/DRESSINGS) ×2 IMPLANT
DURAPREP 26ML APPLICATOR (WOUND CARE) ×2 IMPLANT
FORCEPS CUTTING 33CM 5MM (CUTTING FORCEPS) IMPLANT
FORCEPS CUTTING 45CM 5MM (CUTTING FORCEPS) ×2 IMPLANT
GLOVE BIO SURGEON STRL SZ 6 (GLOVE) ×2 IMPLANT
GLOVE BIOGEL PI IND STRL 6 (GLOVE) ×2 IMPLANT
GLOVE BIOGEL PI IND STRL 7.0 (GLOVE) ×1 IMPLANT
GLOVE BIOGEL PI INDICATOR 6 (GLOVE) ×2
GLOVE BIOGEL PI INDICATOR 7.0 (GLOVE) ×1
GOWN STRL REUS W/TWL LRG LVL3 (GOWN DISPOSABLE) ×6 IMPLANT
NEEDLE INSUFFLATION 120MM (ENDOMECHANICALS) ×2 IMPLANT
NS IRRIG 1000ML POUR BTL (IV SOLUTION) ×2 IMPLANT
PACK LAPAROSCOPY BASIN (CUSTOM PROCEDURE TRAY) ×2 IMPLANT
PAD TRENDELENBURG POSITION (MISCELLANEOUS) ×2 IMPLANT
POUCH SPECIMEN RETRIEVAL 10MM (ENDOMECHANICALS) IMPLANT
SET IRRIG TUBING LAPAROSCOPIC (IRRIGATION / IRRIGATOR) ×2 IMPLANT
SLEEVE XCEL OPT CAN 5 100 (ENDOMECHANICALS) ×2 IMPLANT
STRIP CLOSURE SKIN 1/4X3 (GAUZE/BANDAGES/DRESSINGS) IMPLANT
SUT MNCRL AB 3-0 PS2 27 (SUTURE) ×2 IMPLANT
SUT VICRYL 0 UR6 27IN ABS (SUTURE) ×2 IMPLANT
TOWEL OR 17X24 6PK STRL BLUE (TOWEL DISPOSABLE) ×4 IMPLANT
TROCAR XCEL NON-BLD 11X100MML (ENDOMECHANICALS) ×2 IMPLANT
TROCAR XCEL NON-BLD 5MMX100MML (ENDOMECHANICALS) ×2 IMPLANT
WARMER LAPAROSCOPE (MISCELLANEOUS) ×2 IMPLANT
WATER STERILE IRR 1000ML POUR (IV SOLUTION) IMPLANT

## 2016-02-19 NOTE — Progress Notes (Signed)
I went by PACU to discharge patient and the RN had already discharged without orders or medications.  Per RN, the pt was doing very well and reports she has tramadol at home.  Spoke with patient by phone and she is doing well and will call the office Monday to schedule postop appointment in 2 weeks.  Mitchel HonourMegan Takai Chiaramonte, DO

## 2016-02-19 NOTE — H&P (Signed)
[name] [name] is an 25 y.o. female with known ectopic pregnancy.  She received MTX x 2 doses with appropriate fall in quant after second dose; although quant was 6000.  Patient was asymptomatic until this am when she was awakened by pain.  Ultrasound in MAU shows live ectopic.  Blood type B+.  Pertinent Gynecological History: Menses: n/a Bleeding: n/a Contraception: none DES exposure: unknown Blood transfusions: none Sexually transmitted diseases: n/a Previous GYN Procedures: l/s  Last mammogram: n/a Date: n/a Last pap: n/a Date: n/a OB History: G1P0   Menstrual History: Menarche age: n/a  Patient's last menstrual period was 01/27/2016.    Past Medical History  Diagnosis Date  . Infection     UTI  . Ovarian cyst     Past Surgical History  Procedure Laterality Date  . Hernia repair    . Wisdom tooth extraction    . Laparoscopy    . Knee surgery Right     Family History  Problem Relation Age of Onset  . Diabetes Maternal Grandfather   . Heart disease Paternal Grandmother   . Hyperlipidemia Paternal Grandmother   . Stroke Paternal Grandmother   . Heart disease Paternal Grandfather   . Hyperlipidemia Paternal Grandfather   . COPD Paternal Grandfather   . Healthy Mother   . Hypertension Mother   . Healthy Father   . Hypertension Father     Social History:  reports that she has been smoking Cigarettes.  She has a 2 pack-year smoking history. She has never used smokeless tobacco. She reports that she drinks about 0.5 oz of alcohol per week. She reports that she does not use illicit drugs.  Allergies: No Known Allergies  Prescriptions prior to admission  Medication Sig Dispense Refill Last Dose  . traMADol (ULTRAM) 50 MG tablet Take 1-2 tablets (50-100 mg total) by mouth every 6 (six) hours as needed for severe pain. (Patient not taking: Reported on 02/19/2016) 30 tablet 0 Not Taking at Unknown time    ROS  Blood pressure 114/61, pulse 68, temperature 97.7 F (36.5  C), temperature source Oral, resp. rate 16, weight 126 lb 12 oz (57.493 kg), last menstrual period 01/27/2016, SpO2 100 %. Physical Exam  Constitutional: She is oriented to person, place, and time. She appears well-developed and well-nourished.  GI: Soft. There is tenderness. There is guarding. There is no rebound.  Neurological: She is alert and oriented to person, place, and time.  Skin: Skin is warm and dry.  Psychiatric: She has a normal mood and affect. Her behavior is normal.    Results for orders placed or performed during the hospital encounter of 02/19/16 (from the past 24 hour(s))  CBC     Status: Abnormal   Collection Time: 02/19/16  7:40 AM  Result Value Ref Range   WBC 5.7 4.0 - 10.5 K/uL   RBC 4.02 3.87 - 5.11 MIL/uL   Hemoglobin 12.1 12.0 - 15.0 g/dL   HCT 16.1 (L) 09.6 - 04.5 %   MCV 85.3 78.0 - 100.0 fL   MCH 30.1 26.0 - 34.0 pg   MCHC 35.3 30.0 - 36.0 g/dL   RDW 40.9 81.1 - 91.4 %   Platelets 162 150 - 400 K/uL  Type and screen     Status: None   Collection Time: 02/19/16  8:32 AM  Result Value Ref Range   ABO/RH(D) B POS    Antibody Screen NEG    Sample Expiration 02/22/2016   hCG, quantitative, pregnancy  Status: Abnormal   Collection Time: 02/19/16  8:34 AM  Result Value Ref Range   hCG, Beta Chain, Quant, S 4233 (H) <5 mIU/mL    [place] Ob Transvaginal  02/19/2016  CLINICAL DATA:  Right lower quadrant pain, known ectopic pregnancy EXAM: TRANSVAGINAL OB ULTRASOUND TECHNIQUE: Transvaginal ultrasound was performed for complete evaluation of the gestation as well as the maternal uterus, adnexal regions, and pelvic cul-de-sac. COMPARISON:  None. FINDINGS: Intrauterine gestational sac: None Yolk sac:  Present in the right adnexa Embryo:  Present in the right adnexa Cardiac Activity: Present Heart Rate: 137 bpm CRL:   2.2  mm   5 w 5 d Subchorionic hemorrhage:  None visualized. Maternal uterus/adnexae: 3.2 x 4.4 x 3.7 cm right adnexal mass adjacent to the right ovary,  with associated gestational sac, yolk sac, and fetal pole, with measurements as above. This appearance is compatible with biopsy right adnexal ectopic pregnancy. Small to moderate pelvic ascites, mildly complex. IMPRESSION: No evidence of intrauterine gestation. Single live right adnexal ectopic pregnancy, measuring 5 weeks 5 days by crown-rump length. Small symmetric pelvic ascites, mildly complex. Critical Value/emergent results were called by telephone at the time of interpretation on 02/19/2016 at 9:50 am to [facility] , who verbally acknowledged these results. Electronically Signed   By: Charline BillsSriyesh  Krishnan M.D.   On: 02/19/2016 09:54    Assessment/Plan: 25yo with right ectopic pregnancy; refractory to MTX tx -Proceed to OR for l/s right salpingectomy, removal of right ectopic -Counseled re: risk of bleeding, infection, scarring and damage to surrounding structures.  All questions were answered and the patient wishes to proceed.  Jill Stopka 02/19/2016, 10:42 AM

## 2016-02-19 NOTE — Discharge Instructions (Signed)

## 2016-02-19 NOTE — Transfer of Care (Signed)
Immediate Anesthesia Transfer of Care Note  [name]  Procedure(s) Performed: Procedure(s): LAPAROSCOPY DIAGNOSTIC WITH RIGHT SALPINGECTOMY (N/A)  Patient Location: PACU  Anesthesia Type:General  Level of Consciousness: awake, alert  and oriented  Airway & Oxygen Therapy: Patient Spontanous Breathing and Patient connected to nasal cannula oxygen  Post-op Assessment: Report given to RN and Post -op Vital signs reviewed and stable  Post vital signs: Reviewed and stable  Last Vitals:  Filed Vitals:   02/19/16 0739 02/19/16 1130  BP: 114/61 119/64  Pulse: 68 70  Temp: 36.5 C   Resp: 16 18    Last Pain:  Filed Vitals:   02/19/16 1138  PainSc: 7       Patients Stated Pain Goal: 0 (02/19/16 0738)  Complications: No apparent anesthesia complications

## 2016-02-19 NOTE — MAU Note (Signed)
Urine in lab 

## 2016-02-19 NOTE — MAU Provider Note (Signed)
Chief Complaint: Abdominal Pain and Back Pain   SUBJECTIVE HPI: [name] [name] is a 25 y.o. G1P0 at 6175w5d who presents to Maternity Admissions reporting severe right lower quadrant pain this morning. Know right ectopic pregnancy per office US S/P MTX x 2 on 7'5/17 and 02/13/16.  Results for [name], [name] (MRN [id number]) as of 02/26/2016 08:15  Ref. Range 02/10/2016 14:12 02/13/2016 10:18 02/16/2016 19:30 02/19/2016 08:34  HCG, Beta Chain, Quant, S Latest Ref Range: <5 mIU/mL 5973 (H) 7730 (H) 6438 (H) 4233 (H)    Location: RLQ Quality: "Like contractions" Radiates: upper abd and low back Severity: 10/10 on pain scale Duration: few hours Context: Known right ectopic Timing: constant Modifying factors: Hasn't tried anything for pain Associated signs and symptoms: Neg for fever, chills, VB, N/V/D/C, urinary complaints  Past Medical History  Diagnosis Date  . Infection     UTI  . Ovarian cyst    OB History  Gravida Para Term Preterm AB SAB TAB Ectopic Multiple Living  1         0    # Outcome Date GA Lbr Len/2nd Weight Sex Delivery Anes PTL Lv  1 Current              Past Surgical History  Procedure Laterality Date  . Hernia repair    . Wisdom tooth extraction    . Laparoscopy    . Knee surgery Right    Social History   Social History  . Marital Status: Single    Spouse Name: N/A  . Number of Children: N/A  . Years of Education: N/A   Occupational History  . Not on file.   Social History Main Topics  . Smoking status: Current Every Day Smoker -- 0.50 packs/day for 4 years    Types: Cigarettes  . Smokeless tobacco: Never Used  . Alcohol Use: 0.5 oz/week    1 Standard drinks or equivalent per week     Comment: weekends  . Drug Use: No  . Sexual Activity: Yes   Other Topics Concern  . Not on file   Social History Narrative   No current facility-administered medications on file prior to encounter.   Current Outpatient Prescriptions on File Prior to Encounter   Medication Sig Dispense Refill  . traMADol (ULTRAM) 50 MG tablet Take 1-2 tablets (50-100 mg total) by mouth every 6 (six) hours as needed for severe pain. (Patient not taking: Reported on 02/19/2016) 30 tablet 0   No Known Allergies  I have reviewed the past Medical Hx, Surgical Hx, Social Hx, Allergies and Medications.   Review of Systems  Constitutional: Negative for fever, chills and appetite change.  Gastrointestinal: Positive for abdominal pain. Negative for nausea, vomiting, diarrhea, constipation and abdominal distention.  Genitourinary: Positive for pelvic pain. Negative for dysuria, hematuria, vaginal bleeding and vaginal discharge.  Musculoskeletal: Positive for back pain.  Neurological: Negative for dizziness.    OBJECTIVE Patient Vitals for the past 24 hrs:  BP Temp Temp src Pulse Resp SpO2 Weight  02/19/16 0739 114/61 mmHg 97.7 F (36.5 C) Oral 68 16 100 % 126 lb 12 oz (57.493 kg)   Constitutional: Well-developed, well-nourished female in moderate distress.  Cardiovascular: normal rate Respiratory: normal rate and effort.  GI: Abd soft, moderate RLQ tenderness w/ involuntary guarding. No mass. Pos BS x 4 Neurologic: Alert and oriented x 4.  GU: Neg CVAT.  SPECULUM EXAM: Deferred  LAB RESULTS Results for orders placed or performed during the hospital encounter of 02/19/16 (  from the past 24 hour(s))  CBC     Status: Abnormal   Collection Time: 02/19/16  7:40 AM  Result Value Ref Range   WBC 5.7 4.0 - 10.5 K/uL   RBC 4.02 3.87 - 5.11 MIL/uL   Hemoglobin 12.1 12.0 - 15.0 g/dL   HCT 81.1 (L) 91.4 - 78.2 %   MCV 85.3 78.0 - 100.0 fL   MCH 30.1 26.0 - 34.0 pg   MCHC 35.3 30.0 - 36.0 g/dL   RDW 95.6 21.3 - 08.6 %   Platelets 162 150 - 400 K/uL  Type and screen     Status: None   Collection Time: 02/19/16  8:32 AM  Result Value Ref Range   ABO/RH(D) B POS    Antibody Screen NEG    Sample Expiration 02/22/2016   hCG, quantitative, pregnancy     Status: Abnormal    Collection Time: 02/19/16  8:34 AM  Result Value Ref Range   hCG, Beta Chain, Quant, S 4233 (H) <5 mIU/mL    IMAGING US Ob Transvaginal  02/19/2016  CLINICAL DATA:  Right lower quadrant pain, known ectopic pregnancy EXAM: TRANSVAGINAL OB ULTRASOUND TECHNIQUE: Transvaginal ultrasound was performed for complete evaluation of the gestation as well as the maternal uterus, adnexal regions, and pelvic cul-de-sac. COMPARISON:  None. FINDINGS: Intrauterine gestational sac: None Yolk sac:  Present in the right adnexa Embryo:  Present in the right adnexa Cardiac Activity: Present Heart Rate: 137 bpm CRL:   2.2  mm   5 w 5 d Subchorionic hemorrhage:  None visualized. Maternal uterus/adnexae: 3.2 x 4.4 x 3.7 cm right adnexal mass adjacent to the right ovary, with associated gestational sac, yolk sac, and fetal pole, with measurements as above. This appearance is compatible with biopsy right adnexal ectopic pregnancy. Small to moderate pelvic ascites, mildly complex. IMPRESSION: No evidence of intrauterine gestation. Single live right adnexal ectopic pregnancy, measuring 5 weeks 5 days by crown-rump length. Small symmetric pelvic ascites, mildly complex. Critical Value/emergent results were called by telephone at the time of interpretation on 02/19/2016 at 9:50 am to [facility] , who verbally acknowledged these results. Electronically Signed   By: Charline Bills M.D.   On: 02/19/2016 09:54    MAU COURSE/MDM Orders Placed This Encounter  Procedures  . US OB Transvaginal  . CBC  . hCG, quantitative, pregnancy  . Type and screen   Meds ordered this encounter  Medications  . HYDROmorphone (DILAUDID) injection 1 mg    Sig:   . DISCONTD: lactated ringers infusion   - Live ectopic pregnancy. Hemodynamically stable. Failed MTX x 2 and is not candidate now due to live ectopic.   ASSESSMENT 1. Ectopic pregnancy   2. Abdominal pain affecting pregnancy, antepartum     PLAN Prep for OR. Dr. Langston Masker  assuming care of pt.   [place], CNM 02/19/2016  11:24 AM

## 2016-02-19 NOTE — Anesthesia Postprocedure Evaluation (Signed)
Anesthesia Post Note  [name]  Procedure(s) Performed: Procedure(s) (LRB): LAPAROSCOPY DIAGNOSTIC WITH RIGHT SALPINGECTOMY (N/A)  Patient location during evaluation: PACU Anesthesia Type: General Level of consciousness: awake and alert, oriented and patient cooperative Pain management: pain level controlled Vital Signs Assessment: post-procedure vital signs reviewed and stable Respiratory status: spontaneous breathing, nonlabored ventilation and respiratory function stable Cardiovascular status: blood pressure returned to baseline and stable Postop Assessment: no signs of nausea or vomiting Anesthetic complications: no     Last Vitals:  Filed Vitals:   02/19/16 1400 02/19/16 1415  BP: 117/61 112/69  Pulse: 69 68  Temp:  36.6 C  Resp: 12 13    Last Pain:  Filed Vitals:   02/19/16 1420  PainSc: 7    Pain Goal: Patients Stated Pain Goal: 0 (02/19/16 1415)               Antoninette Lerner,E. Leona Pressly

## 2016-02-19 NOTE — Anesthesia Procedure Notes (Signed)
Procedure Name: Intubation Date/Time: 02/19/2016 11:50 AM Performed by: Junious SilkGILBERT, Dian Minahan Pre-anesthesia Checklist: Patient identified, Emergency Drugs available, Suction available, Patient being monitored and Timeout performed Patient Re-evaluated:Patient Re-evaluated prior to inductionOxygen Delivery Method: Circle system utilized Preoxygenation: Pre-oxygenation with 100% oxygen Intubation Type: IV induction, Rapid sequence and Cricoid Pressure applied Laryngoscope Size: Miller and 2 Grade View: Grade I Tube type: Oral Tube size: 7.0 mm Number of attempts: 1 Airway Equipment and Method: Stylet Placement Confirmation: ETT inserted through vocal cords under direct vision,  positive ETCO2,  CO2 detector and breath sounds checked- equal and bilateral Secured at: 22 cm Tube secured with: Tape Dental Injury: Teeth and Oropharynx as per pre-operative assessment

## 2016-02-19 NOTE — MAU Note (Signed)
Awakened by pain in lower abd,like a contracting pain.  Pain in low back- going up her back.  Pushing pressure going into upper abd. Denies bleeding.

## 2016-02-19 NOTE — Anesthesia Preprocedure Evaluation (Addendum)
Anesthesia Evaluation  Patient identified by MRN, date of birth, ID band Patient awake    Reviewed: Allergy & Precautions, NPO status , Patient's Chart, lab work & pertinent test results  History of Anesthesia Complications Negative for: history of anesthetic complications  Airway Mallampati: II  TM Distance: >3 FB Neck ROM: Full    Dental  (+) Dental Advisory Given   Pulmonary Current Smoker,    breath sounds clear to auscultation       Cardiovascular negative cardio ROS   Rhythm:Regular Rate:Normal     Neuro/Psych  Headaches,    GI/Hepatic negative GI ROS, Neg liver ROS,   Endo/Other  negative endocrine ROS  Renal/GU negative Renal ROS     Musculoskeletal negative musculoskeletal ROS (+)   Abdominal   Peds  Hematology Hb 12.1   Anesthesia Other Findings   Reproductive/Obstetrics (+) Pregnancy                            Anesthesia Physical Anesthesia Plan  ASA: II and emergent  Anesthesia Plan: General   Post-op Pain Management:    Induction: Intravenous, Rapid sequence and Cricoid pressure planned  Airway Management Planned: Oral ETT  Additional Equipment:   Intra-op Plan:   Post-operative Plan: Extubation in OR  Informed Consent: I have reviewed the patients History and Physical, chart, labs and discussed the procedure including the risks, benefits and alternatives for the proposed anesthesia with the patient or authorized representative who has indicated his/her understanding and acceptance.   Dental advisory given  Plan Discussed with: CRNA and Surgeon  Anesthesia Plan Comments: (Plan routine monitors, GETA)        Anesthesia Quick Evaluation

## 2016-02-19 NOTE — Op Note (Signed)
[place] PROCEDURE DATE: 02/19/2016  PREOPERATIVE DIAGNOSIS: Ruptured right ectopic pregnancy POSTOPERATIVE DIAGNOSIS: Ruptured right fallopian tube ectopic pregnancy PROCEDURE: Laparoscopic right salpingectomy and removal of ectopic pregnancy SURGEON:  Dr. Mitchel HonourMegan Ceili Boshers  INDICATIONS: 25 y.o. G1P0 at 4123w5d here for with ruptured ectopic pregnancy. On exam, she had stable vital signs, and an acute abdomen. Hgb 12.1, blood type B+. Patient was counseled regarding need for laparoscopic salpingectomy. Risks of surgery including bleeding which may require transfusion or reoperation, infection, injury to bowel or other surrounding organs, need for additional procedures including laparotomy and other postoperative/anesthesia complications were explained to patient.  Written informed consent was obtained.  FINDINGS:  Minimal amount of hemoperitoneum estimated to be about 100cc of blood and clots.  Dilated right fallopian tube containing ectopic gestation. Small normal appearing uterus, clubbed left fallopian tube, left ovary and right ovary.  ANESTHESIA: General ESTIMATED BLOOD LOSS: 100cc hemoperitoneum + 20cc surgical blood loss ml SPECIMENS: right fallopian tube containing ectopic gestation COMPLICATIONS: None immediate  PROCEDURE IN DETAIL:  The patient was taken to the operating room where general anesthesia was administered and was found to be adequate.  She was placed in the dorsal lithotomy position, and was prepped and draped in a sterile manner.  An in and out catheterization was performed and a uterine manipulator was then advanced into the uterus .  After an adequate timeout was performed, attention was then turned to the patient's abdomen where a 10-mm skin incision was made on the umbilical fold.  The Veress needle was carefully introduced into the peritoneal cavity at a 45-degree angle into the abdominal wall.  Intraperitoneal placement was confirmed by drop in intraabdominal pressure with  insufflation of carbon dioxide gas.  Adequate pneumoperitoneum was obtained, and the 10-mm trocar and sleeve were then advanced without difficulty into the abdomen where intraabdominal placement was confirmed by the laparoscope. A survey of the patient's pelvis and abdomen revealed the findings as above.  Bilateral lower quadrant ports were placed under direct visualization; 5-mm on the left and 5-mm on the right.  The suction irrigator was then used to suction the hemoperitoneum and irrigate the pelvis.  Attention was then turned to the right fallopian tube which was grasped and ligated from the underlying mesosalpinx and uterine attachment using the Gyrus instrument.  Good hemostasis was noted.  The specimen was placed in an EndoCatch bag and removed from the abdomen intact.  The abdomen was desufflated, and all instruments were removed.  The fascial incisions of the 10-mm site was reapproximated with 0 Vicryl figure-of-eight stiches; and all skin incisions were closed with a 3-0 Monocryl subcuticular stitch. The patient tolerated the procedures well.  All instruments, needles, and sponge counts were correct x 2. The patient was taken to the recovery room in stable condition.   Orvetta Danielski 02/19/2016 1:06 PM

## 2016-02-22 ENCOUNTER — Encounter (HOSPITAL_COMMUNITY): Payer: Self-pay | Admitting: Obstetrics & Gynecology

## 2016-02-26 DIAGNOSIS — O009 Unspecified ectopic pregnancy without intrauterine pregnancy: Secondary | ICD-10-CM | POA: Insufficient documentation

## 2016-03-11 ENCOUNTER — Inpatient Hospital Stay (HOSPITAL_COMMUNITY)
Admission: AD | Admit: 2016-03-11 | Discharge: 2016-03-11 | Disposition: A | Discharge status: Home / Self Care | Payer: BLUE CROSS/BLUE SHIELD | Source: Ambulatory Visit | Attending: Obstetrics & Gynecology | Admitting: Obstetrics & Gynecology

## 2016-03-11 ENCOUNTER — Encounter (HOSPITAL_COMMUNITY): Payer: Self-pay | Admitting: *Deleted

## 2016-03-11 DIAGNOSIS — Z79899 Other long term (current) drug therapy: Secondary | ICD-10-CM | POA: Diagnosis not present

## 2016-03-11 DIAGNOSIS — N83209 Unspecified ovarian cyst, unspecified side: Secondary | ICD-10-CM | POA: Diagnosis not present

## 2016-03-11 DIAGNOSIS — R1030 Lower abdominal pain, unspecified: Secondary | ICD-10-CM

## 2016-03-11 DIAGNOSIS — F17210 Nicotine dependence, cigarettes, uncomplicated: Secondary | ICD-10-CM | POA: Diagnosis not present

## 2016-03-11 DIAGNOSIS — R109 Unspecified abdominal pain: Secondary | ICD-10-CM | POA: Diagnosis present

## 2016-03-11 HISTORY — DX: Unspecified ectopic pregnancy without intrauterine pregnancy: O00.90

## 2016-03-11 LAB — CBC
HEMATOCRIT: 37.7 % (ref 36.0–46.0)
HEMOGLOBIN: 13.1 g/dL (ref 12.0–15.0)
MCH: 30.3 pg (ref 26.0–34.0)
MCHC: 34.7 g/dL (ref 30.0–36.0)
MCV: 87.1 fL (ref 78.0–100.0)
Platelets: 272 10*3/uL (ref 150–400)
RBC: 4.33 MIL/uL (ref 3.87–5.11)
RDW: 12.6 % (ref 11.5–15.5)
WBC: 9.6 10*3/uL (ref 4.0–10.5)

## 2016-03-11 LAB — URINALYSIS, ROUTINE W REFLEX MICROSCOPIC
Bilirubin Urine: NEGATIVE
GLUCOSE, UA: NEGATIVE mg/dL
HGB URINE DIPSTICK: NEGATIVE
Ketones, ur: NEGATIVE mg/dL
Leukocytes, UA: NEGATIVE
Nitrite: NEGATIVE
PH: 6 (ref 5.0–8.0)
Protein, ur: NEGATIVE mg/dL

## 2016-03-11 LAB — POCT PREGNANCY, URINE: Preg Test, Ur: NEGATIVE

## 2016-03-11 LAB — HCG, QUANTITATIVE, PREGNANCY: hCG, Beta Chain, Quant, S: 3 m[IU]/mL (ref <5)

## 2016-03-11 MED ORDER — IBUPROFEN 600 MG PO TABS: 600.0000 mg | ORAL_TABLET | Freq: Four times a day (QID) | ORAL | 0 refills | Status: DC | PRN

## 2016-03-11 MED ORDER — IBUPROFEN 600 MG PO TABS
600.0000 mg | ORAL_TABLET | Freq: Once | ORAL | Status: AC
  Administered 2016-03-11: 600 mg via ORAL
  Filled 2016-03-11: qty 1

## 2016-03-11 NOTE — Discharge Instructions (Signed)

## 2016-03-11 NOTE — MAU Provider Note (Signed)
History     CSN: [id number]  Arrival date and time: 03/11/16 1414   None     Chief Complaint  Patient presents with  . Post-op Problem   HPI   [name] is a 25 y.o. female G1P0010 here with abdominal pain. On 7/14 she underwent a Laparoscopy with right salpingectomy and removal of ruptured ectopic pregnancy. Dr. Mitchel Honour performed her surgery.   She describes the abdominal pain as a contraction like pain. It feels like squeezing pain. The pain is located in the middle of her lower abdomen. She had this pain at the time she found out about the ectopic pregnancy. She took ibuprofen @ 11:00, this did not help. She currently rates her pain 7/10.  She denies fever.   She has not had a period since the surgery. She does not have a history of painful periods or heavy periods.   OB History    Gravida Para Term Preterm AB Living   1       1 0   SAB TAB Ectopic Multiple Live Births       1          Past Medical History:  Diagnosis Date  . Ectopic pregnancy   . Infection    UTI  . Ovarian cyst     Past Surgical History:  Procedure Laterality Date  . HERNIA REPAIR    . KNEE SURGERY Right   . LAPAROSCOPY    . LAPAROSCOPY N/A 02/19/2016   Procedure: LAPAROSCOPY DIAGNOSTIC WITH RIGHT SALPINGECTOMY;  Surgeon: Mitchel Honour, DO;  Location: [facility];  Service: Gynecology;  Laterality: N/A;  . WISDOM TOOTH EXTRACTION      Family History  Problem Relation Age of Onset  . Diabetes Maternal Grandfather   . Heart disease Paternal Grandmother   . Hyperlipidemia Paternal Grandmother   . Stroke Paternal Grandmother   . Heart disease Paternal Grandfather   . Hyperlipidemia Paternal Grandfather   . COPD Paternal Grandfather   . Healthy Mother   . Hypertension Mother   . Healthy Father   . Hypertension Father     Social History  Substance Use Topics  . Smoking status: Current Every Day Smoker    Packs/day: 0.50    Years: 4.00    Types: Cigarettes  . Smokeless tobacco:  Never Used  . Alcohol use 0.5 oz/week    1 Standard drinks or equivalent per week     Comment: weekends    Allergies: No Known Allergies  Prescriptions Prior to Admission  Medication Sig Dispense Refill Last Dose  . ibuprofen (ADVIL,MOTRIN) 200 MG tablet Take 600 mg by mouth every 6 (six) hours as needed for moderate pain.   03/11/2016 at Unknown time  . traMADol (ULTRAM) 50 MG tablet Take 1-2 tablets (50-100 mg total) by mouth every 6 (six) hours as needed for severe pain. (Patient not taking: Reported on 02/19/2016) 30 tablet 0 Not Taking at Unknown time   Results for orders placed or performed during the hospital encounter of 03/11/16 (from the past 48 hour(s))  Urinalysis, Routine w reflex microscopic (not at [facility])     Status: Abnormal   Collection Time: 03/11/16  2:55 PM  Result Value Ref Range   Color, Urine YELLOW YELLOW   APPearance CLEAR CLEAR   Specific Gravity, Urine <1.005 (L) 1.005 - 1.030   pH 6.0 5.0 - 8.0   Glucose, UA NEGATIVE NEGATIVE mg/dL   Hgb urine dipstick NEGATIVE NEGATIVE   Bilirubin Urine NEGATIVE  NEGATIVE   Ketones, ur NEGATIVE NEGATIVE mg/dL   Protein, ur NEGATIVE NEGATIVE mg/dL   Nitrite NEGATIVE NEGATIVE   Leukocytes, UA NEGATIVE NEGATIVE    Comment: MICROSCOPIC NOT DONE ON URINES WITH NEGATIVE PROTEIN, BLOOD, LEUKOCYTES, NITRITE, OR GLUCOSE <1000 mg/dL.  hCG, quantitative, pregnancy     Status: None   Collection Time: 03/11/16  3:04 PM  Result Value Ref Range   hCG, Beta Chain, Quant, S 3 <5 mIU/mL    Comment:          GEST. AGE      CONC.  (mIU/mL)   <=1 WEEK        5 - 50     2 WEEKS       50 - 500     3 WEEKS       100 - 10,000     4 WEEKS     1,000 - 30,000     5 WEEKS     3,500 - 115,000   6-8 WEEKS     12,000 - 270,000    12 WEEKS     15,000 - 220,000        FEMALE AND NON-PREGNANT FEMALE:     LESS THAN 5 mIU/mL   Pregnancy, urine POC     Status: None   Collection Time: 03/11/16  3:28 PM  Result Value Ref Range   Preg Test, Ur NEGATIVE  NEGATIVE    Comment:        THE SENSITIVITY OF THIS METHODOLOGY IS >24 mIU/mL   CBC     Status: None   Collection Time: 03/11/16  4:38 PM  Result Value Ref Range   WBC 9.6 4.0 - 10.5 K/uL   RBC 4.33 3.87 - 5.11 MIL/uL   Hemoglobin 13.1 12.0 - 15.0 g/dL   HCT 16.1 09.6 - 04.5 %   MCV 87.1 78.0 - 100.0 fL   MCH 30.3 26.0 - 34.0 pg   MCHC 34.7 30.0 - 36.0 g/dL   RDW 40.9 81.1 - 91.4 %   Platelets 272 150 - 400 K/uL     Review of Systems  Constitutional: Negative for chills and fever.  Gastrointestinal: Positive for abdominal pain. Negative for constipation, diarrhea, nausea and vomiting.  Genitourinary: Negative for dysuria, frequency and urgency.   Physical Exam   Blood pressure 130/87, pulse 75, temperature 98.3 F (36.8 C), resp. rate 16, last menstrual period 01/27/2016, SpO2 100 %, unknown if currently breastfeeding.  Physical Exam  Constitutional: She is oriented to person, place, and time. She appears well-developed and well-nourished.  Non-toxic appearance. She does not have a sickly appearance. She does not appear ill. No distress.  HENT:  Head: Normocephalic.  Eyes: Pupils are equal, round, and reactive to light.  Neck: Neck supple.  Respiratory: Effort normal.  GI: Soft. Normal appearance. There is tenderness in the suprapubic area. There is no rigidity, no rebound and no guarding.  Musculoskeletal: Normal range of motion.  Neurological: She is alert and oriented to person, place, and time.  Skin: Skin is warm. She is not diaphoretic.  Psychiatric: Her behavior is normal.    MAU Course  Procedures  None  MDM  CBC UA  Beta hcg   Unidentified cause of abdominal pain> unlikely related to her surgery on 7/14> she denies N/v, fever> pain possibly due to the start of a menstrual cycle. Pain relieved with 600 mg of ibuprofen, patient declined narcotics for pain. Discussed HPI>labs and PE with Dr. Langston Masker.  Assessment and Plan   A:  1. Lower abdominal pain      P:  Discharge home in stable condition RX: ibuprofen Return to MAU as needed, if symptoms worsen Follow up with DR. Rich Fuchs, NP 03/11/2016 6:51 PM

## 2016-03-11 NOTE — MAU Note (Signed)
Pt presents to MAU with complaints of lower abdominal pain that has started to re occur.. Had surgery for ectopic on July the 14th. Denies any  Vaginal bleeding.

## 2016-07-22 ENCOUNTER — Ambulatory Visit (INDEPENDENT_AMBULATORY_CARE_PROVIDER_SITE_OTHER): Payer: BLUE CROSS/BLUE SHIELD | Admitting: Physician Assistant

## 2016-07-22 ENCOUNTER — Encounter: Payer: Self-pay | Admitting: Physician Assistant

## 2016-07-22 VITALS — BP 122/76 | HR 84 | Temp 98.4°F | Resp 16 | Wt 127.0 lb

## 2016-07-22 DIAGNOSIS — J019 Acute sinusitis, unspecified: Secondary | ICD-10-CM

## 2016-07-22 DIAGNOSIS — H66004 Acute suppurative otitis media without spontaneous rupture of ear drum, recurrent, right ear: Secondary | ICD-10-CM | POA: Diagnosis not present

## 2016-07-22 MED ORDER — AMOXICILLIN-POT CLAVULANATE 875-125 MG PO TABS: 1.0000 | ORAL_TABLET | Freq: Two times a day (BID) | ORAL | 0 refills | Status: AC

## 2016-07-22 NOTE — Progress Notes (Signed)
[name]  Chief Complaint  Patient presents with  . Sinusitis  . URI    Subjective:    Patient ID: [name] [name], female    DOB: 05-Jun-1991, 25 y.o.   MRN: [id number]  Upper Respiratory Infection: [name] [name] is a 25 y.o. female with a past medical history significant for Tobacco abuse, Eustachian tube dysfunction, and Allergic Rhinitis complaining of symptoms of a URI, possible sinusitis. Symptoms include congestion. Onset of symptoms was 1 week ago, gradually worsening since that time. She also c/o right ear pressure/pain, congestion, cough described as productive, headache described as Sinus headache and nasal congestion for the past 1 week .  She is drinking plenty of fluids. Evaluation to date: none. Treatment to date: cough suppressants, decongestants and nasal steroids. The treatment has provided no.    Patient reports she tends to get recurrent ear infections but has not seen ENT since she was a kid. Does have a history of tympanostomy tubes.  Review of Systems  Constitutional: Positive for fatigue. Negative for activity change, appetite change, chills, diaphoresis, fever and unexpected weight change.  HENT: Positive for congestion, ear pain, nosebleeds, postnasal drip, rhinorrhea, sinus pain, sinus pressure and sore throat. Negative for ear discharge, sneezing, tinnitus, trouble swallowing and voice change.   Eyes: Positive for discharge, redness and itching. Negative for photophobia and pain.  Respiratory: Positive for cough. Negative for apnea, choking, chest tightness, shortness of breath, wheezing and stridor.   Gastrointestinal: Negative.   Musculoskeletal: Positive for neck stiffness.  Neurological: Positive for light-headedness and headaches. Negative for dizziness.       Objective:   BP 122/76 (BP Location: Left Arm, Patient Position: Sitting, Cuff Size: Normal)   Pulse 84   Temp 98.4 F (36.9 C) (Oral)   Resp 16   Wt  127 lb (57.6 kg)   LMP 07/14/2016   BMI 21.13 kg/m   Patient Active Problem List   Diagnosis Date Noted  . Ectopic pregnancy   . Dysfunction of eustachian tube 09/29/2015  . Compulsive tobacco user syndrome 09/29/2015  . Headache, migraine 09/29/2015    Outpatient Encounter Prescriptions as of 07/22/2016  Medication Sig  . amoxicillin-clavulanate (AUGMENTIN) 875-125 MG tablet Take 1 tablet by mouth 2 (two) times daily.  . [DISCONTINUED] ibuprofen (ADVIL,MOTRIN) 600 MG tablet Take 1 tablet (600 mg total) by mouth every 6 (six) hours as needed.  . [DISCONTINUED] traMADol (ULTRAM) 50 MG tablet Take 1-2 tablets (50-100 mg total) by mouth every 6 (six) hours as needed for severe pain. (Patient not taking: Reported on 02/19/2016)   No facility-administered encounter medications on file as of 07/22/2016.     No Known Allergies     Physical Exam  Constitutional: She is oriented to person, place, and time. She appears well-developed and well-nourished. No distress.  HENT:  Right Ear: External ear normal. Tympanic membrane is injected, erythematous and bulging.  Left Ear: External ear normal. Tympanic membrane is scarred. Tympanic membrane is not injected, not erythematous and not bulging.  Nose: Right sinus exhibits maxillary sinus tenderness and frontal sinus tenderness. Left sinus exhibits maxillary sinus tenderness and frontal sinus tenderness.  Mouth/Throat: Oropharynx is clear and moist. No oropharyngeal exudate, posterior oropharyngeal edema or posterior oropharyngeal erythema.  Eyes: Conjunctivae are normal. Right eye exhibits no discharge. Left eye exhibits no discharge.  Neck: Neck supple.  Cardiovascular: Normal rate and regular rhythm.   Pulmonary/Chest: Effort normal and breath sounds normal.  Lymphadenopathy:    She has  cervical adenopathy.  Neurological: She is alert and oriented to person, place, and time.  Skin: Skin is warm and dry. She is not diaphoretic.   Psychiatric: She has a normal mood and affect. Her behavior is normal.       Assessment & Plan:   Problem List Items Addressed This Visit    None    Visit Diagnoses    Acute sinusitis, recurrence not specified, unspecified location    -  Primary   Relevant Medications   amoxicillin-clavulanate (AUGMENTIN) 875-125 MG tablet   Recurrent acute suppurative otitis media of right ear without spontaneous rupture of tympanic membrane       Relevant Medications   amoxicillin-clavulanate (AUGMENTIN) 875-125 MG tablet      Patient is 25 y/o female presenting with sinusitis and acute right sided otitis media. Will treat with Augmentin as above. If patient wishes to see ENT for recurrent Om, will refer. Work note provided.  The entirety of the information documented in the History of Present Illness, Review of Systems and Physical Exam were personally obtained by me. Portions of this information were initially documented by Kavin LeechLaura Demauri Advincula, CMA and reviewed by me for thoroughness and accuracy.   Return if symptoms worsen or fail to improve.   Patient Instructions  Sinusitis, Adult Sinusitis is soreness and inflammation of your sinuses. Sinuses are hollow spaces in the bones around your face. They are located:  Around your eyes.  In the middle of your forehead.  Behind your nose.  In your cheekbones. Your sinuses and nasal passages are lined with a stringy fluid (mucus). Mucus normally drains out of your sinuses. When your nasal tissues get inflamed or swollen, the mucus can get trapped or blocked so air cannot flow through your sinuses. This lets bacteria, viruses, and funguses grow, and that leads to infection. Follow these instructions at home: Medicines  Take, use, or apply over-the-counter and prescription medicines only as told by your doctor. These may include nasal sprays.  If you were prescribed an antibiotic medicine, take it as told by your doctor. Do not stop taking the  antibiotic even if you start to feel better. Hydrate and Humidify  Drink enough water to keep your pee (urine) clear or pale yellow.  Use a cool mist humidifier to keep the humidity level in your home above 50%.  Breathe in steam for 10-15 minutes, 3-4 times a day or as told by your doctor. You can do this in the bathroom while a hot shower is running.  Try not to spend time in cool or dry air. Rest  Rest as much as possible.  Sleep with your head raised (elevated).  Make sure to get enough sleep each night. General instructions  Put a warm, moist washcloth on your face 3-4 times a day or as told by your doctor. This will help with discomfort.  Wash your hands often with soap and water. If there is no soap and water, use hand sanitizer.  Do not smoke. Avoid being around people who are smoking (secondhand smoke).  Keep all follow-up visits as told by your doctor. This is important. Contact a doctor if:  You have a fever.  Your symptoms get worse.  Your symptoms do not get better within 10 days. Get help right away if:  You have a very bad headache.  You cannot stop throwing up (vomiting).  You have pain or swelling around your face or eyes.  You have trouble seeing.  You feel confused.  Your neck is stiff.  You have trouble breathing. This information is not intended to replace advice given to you by your health care provider. Make sure you discuss any questions you have with your health care provider. Document Released: 01/11/2008 Document Revised: 03/20/2016 Document Reviewed: 05/20/2015 Elsevier Interactive Patient Education  2017 [employer].     The entirety of the information documented in the History of Present Illness, Review of Systems and Physical Exam were personally obtained by me. Portions of this information were initially documented by Kavin Leech, CMA and reviewed by me for thoroughness and accuracy.

## 2016-07-22 NOTE — Patient Instructions (Signed)

## 2016-08-05 ENCOUNTER — Ambulatory Visit (INDEPENDENT_AMBULATORY_CARE_PROVIDER_SITE_OTHER): Payer: BLUE CROSS/BLUE SHIELD | Admitting: Physician Assistant

## 2016-08-05 ENCOUNTER — Encounter: Payer: Self-pay | Admitting: Physician Assistant

## 2016-08-05 VITALS — BP 122/68 | HR 90 | Temp 98.0°F | Resp 16 | Wt 133.0 lb

## 2016-08-05 DIAGNOSIS — H6593 Unspecified nonsuppurative otitis media, bilateral: Secondary | ICD-10-CM | POA: Diagnosis not present

## 2016-08-05 DIAGNOSIS — J0111 Acute recurrent frontal sinusitis: Secondary | ICD-10-CM

## 2016-08-05 MED ORDER — AMOXICILLIN-POT CLAVULANATE 875-125 MG PO TABS: 1.0000 | ORAL_TABLET | Freq: Two times a day (BID) | ORAL | 0 refills | Status: AC

## 2016-08-05 NOTE — Progress Notes (Signed)
[name] Female    DOB: 1990-09-20   25 y.o.   MRN: [id number] Visit Date: 08/05/2016  Today's Provider: Trey SailorsAdriana M Pollak, PA-C   Chief Complaint  Patient presents with  . URI   Subjective:    HPI Pt is here for sinus congestion and ear pain. She reports that she was treated for sinus infection and ear infection on 07/22/2016. She took Augmentin 875/125 BID for 10 days and finished it up on this past Sunday. It helped, but when she stoped she has started having sinus congestion again and ear pain in her left ear and ear fullness in her right ear.She also has sinus pain and pressure.  Denies sore throat, cough, fevers, chills, sweats.     No Known Allergies   Current Outpatient Prescriptions:  .  loratadine (CLARITIN) 10 MG tablet, Take 10 mg by mouth daily., Disp: , Rfl:  .  amoxicillin-clavulanate (AUGMENTIN) 875-125 MG tablet, Take 1 tablet by mouth 2 (two) times daily., Disp: 20 tablet, Rfl: 0  Review of Systems  Constitutional: Negative.   HENT: Positive for congestion, rhinorrhea, sinus pain and sinus pressure.   Eyes: Positive for pain.  Respiratory: Negative.   Cardiovascular: Negative.   Gastrointestinal: Negative.   Endocrine: Negative.   Genitourinary: Negative.   Musculoskeletal: Negative.   Skin: Negative.   Allergic/Immunologic: Negative.   Neurological: Negative.   Hematological: Negative.   Psychiatric/Behavioral: Negative.     Social History  Substance Use Topics  . Smoking status: Current Every Day Smoker    Packs/day: 0.50    Years: 4.00    Types: Cigarettes  . Smokeless tobacco: Never Used  . Alcohol use 0.5 oz/week    1 Standard drinks or equivalent per week     Comment: weekends   Objective:   BP 122/68 (BP Location: Right Arm, Patient Position: Sitting, Cuff Size: Normal)   Pulse 90   Temp 98 F (36.7 C) (Oral)   Resp 16   Wt 133 lb (60.3 kg)   LMP 07/14/2016   SpO2 97%   BMI 22.13 kg/m   Physical Exam    Constitutional: She is oriented to person, place, and time. She appears well-developed and well-nourished. No distress.  HENT:  Right Ear: External ear normal. Tympanic membrane is erythematous and bulging.  Left Ear: External ear normal. Tympanic membrane is erythematous and bulging.  Nose: Right sinus exhibits maxillary sinus tenderness and frontal sinus tenderness. Left sinus exhibits maxillary sinus tenderness and frontal sinus tenderness.  Mouth/Throat: Oropharynx is clear and moist. No oropharyngeal exudate, posterior oropharyngeal edema or posterior oropharyngeal erythema.  Tms opaque bilaterally   Eyes: Conjunctivae are normal. Right eye exhibits no discharge. Left eye exhibits no discharge.  Neck: Neck supple.  Cardiovascular: Normal rate and regular rhythm.   Pulmonary/Chest: Effort normal and breath sounds normal.  Lymphadenopathy:    She has cervical adenopathy.  Neurological: She is alert and oriented to person, place, and time.  Skin: Skin is warm and dry. She is not diaphoretic.  Psychiatric: She has a normal mood and affect. Her behavior is normal.        Assessment & Plan:     1. Acute recurrent frontal sinusitis  Will treat with another course of augmentin. Also put in referral   - amoxicillin-clavulanate (AUGMENTIN) 875-125 MG tablet; Take 1 tablet by mouth 2 (two) times daily.  Dispense: 20 tablet; Refill: 0 - Ambulatory referral to ENT  2. Other nonsuppurative otitis  media of both ears, unspecified chronicity  Will treat with another course of augmentin.  - amoxicillin-clavulanate (AUGMENTIN) 875-125 MG tablet; Take 1 tablet by mouth 2 (two) times daily.  Dispense: 20 tablet; Refill: 0  The entirety of the information documented in the History of Present Illness, Review of Systems and Physical Exam were personally obtained by me. Portions of this information were initially documented by [place]any Byrd and reviewed by me for thoroughness and accuracy.     Patient Instructions  Sinusitis, Adult Sinusitis is soreness and inflammation of your sinuses. Sinuses are hollow spaces in the bones around your face. They are located:  Around your eyes.  In the middle of your forehead.  Behind your nose.  In your cheekbones. Your sinuses and nasal passages are lined with a stringy fluid (mucus). Mucus normally drains out of your sinuses. When your nasal tissues get inflamed or swollen, the mucus can get trapped or blocked so air cannot flow through your sinuses. This lets bacteria, viruses, and funguses grow, and that leads to infection. Follow these instructions at home: Medicines  Take, use, or apply over-the-counter and prescription medicines only as told by your doctor. These may include nasal sprays.  If you were prescribed an antibiotic medicine, take it as told by your doctor. Do not stop taking the antibiotic even if you start to feel better. Hydrate and Humidify  Drink enough water to keep your pee (urine) clear or pale yellow.  Use a cool mist humidifier to keep the humidity level in your home above 50%.  Breathe in steam for 10-15 minutes, 3-4 times a day or as told by your doctor. You can do this in the bathroom while a hot shower is running.  Try not to spend time in cool or dry air. Rest  Rest as much as possible.  Sleep with your head raised (elevated).  Make sure to get enough sleep each night. General instructions  Put a warm, moist washcloth on your face 3-4 times a day or as told by your doctor. This will help with discomfort.  Wash your hands often with soap and water. If there is no soap and water, use hand sanitizer.  Do not smoke. Avoid being around people who are smoking (secondhand smoke).  Keep all follow-up visits as told by your doctor. This is important. Contact a doctor if:  You have a fever.  Your symptoms get worse.  Your symptoms do not get better within 10 days. Get help right away  if:  You have a very bad headache.  You cannot stop throwing up (vomiting).  You have pain or swelling around your face or eyes.  You have trouble seeing.  You feel confused.  Your neck is stiff.  You have trouble breathing. This information is not intended to replace advice given to you by your health care provider. Make sure you discuss any questions you have with your health care provider. Document Released: 01/11/2008 Document Revised: 03/20/2016 Document Reviewed: 05/20/2015 Elsevier Interactive Patient Education  2017 [employer]     Return if symptoms worsen or fail to improve.        Trey SailorsAdriana M Pollak, PA-C  [facility]

## 2016-08-05 NOTE — Patient Instructions (Signed)

## 2016-09-09 ENCOUNTER — Telehealth: Payer: Self-pay

## 2016-09-09 NOTE — Telephone Encounter (Signed)
Pt advised and appointment made-aa 

## 2016-09-09 NOTE — Telephone Encounter (Signed)
Please review-aa 

## 2016-09-09 NOTE — Telephone Encounter (Signed)
Patients mother called office requesting lab slip to check patients thyroid level. Mother states that she has thyroid problems and patient has had issue with weight, hair loss, being moody and complaining of chills . I advised mother that patient might have to come in office to be assessed first to address these issues but I told her I would sent back request to be reviewed. KW

## 2016-09-09 NOTE — Telephone Encounter (Signed)
Yes please have her schedule an office visit as I have only seen her for ear infections. Thank you.

## 2016-09-13 ENCOUNTER — Ambulatory Visit (INDEPENDENT_AMBULATORY_CARE_PROVIDER_SITE_OTHER): Payer: BLUE CROSS/BLUE SHIELD | Admitting: Physician Assistant

## 2016-09-13 ENCOUNTER — Encounter: Payer: Self-pay | Admitting: Physician Assistant

## 2016-09-13 VITALS — BP 122/76 | HR 80 | Temp 98.5°F | Resp 16 | Wt 128.0 lb

## 2016-09-13 DIAGNOSIS — L659 Nonscarring hair loss, unspecified: Secondary | ICD-10-CM | POA: Diagnosis not present

## 2016-09-13 DIAGNOSIS — R6889 Other general symptoms and signs: Secondary | ICD-10-CM

## 2016-09-13 NOTE — Progress Notes (Signed)
[name] Female    DOB: March 31, 1991   26 y.o.   MRN: [id number] Visit Date: 09/13/2016  Today's Provider: Trey Sailors, PA-C   Chief Complaint  Patient presents with  . Thyroid Problem   Subjective:    Thyroid Problem  Presents for initial visit. Symptoms include anxiety, cold intolerance, fatigue, hair loss, hoarse voice and weight gain. Patient reports no constipation, depressed mood, diaphoresis, diarrhea, heat intolerance, leg swelling, menstrual problem, nail problem, palpitations or weight loss. Risk factors include family history of hypothyroidism.   Patient is a 26 y/o woman with history of eustachian tube dysfunction and recurrent ear infections recently finishing a prednisone taper presenting today for concerns of thyroid problems. She reports that she has had some weight gain without eating much. She reports her hair has not grown in two years and falls out often. She also reports some cold intolerance. No trouble swallowing. Pertinent negatives listed above.  No Known Allergies   Current Outpatient Prescriptions:  .  Azelastine-Fluticasone (DYMISTA) 137-50 MCG/ACT SUSP, Place into the nose., Disp: , Rfl:  .  loratadine (CLARITIN) 10 MG tablet, Take 10 mg by mouth daily., Disp: , Rfl:  .  montelukast (SINGULAIR) 10 MG tablet, Take 10 mg by mouth at bedtime., Disp: , Rfl:   Review of Systems  Constitutional: Positive for fatigue, unexpected weight change (Pt reports she is gaining some weight.) and weight gain. Negative for activity change, appetite change, chills, diaphoresis, fever and weight loss.  HENT: Positive for hoarse voice and voice change. Negative for sore throat and trouble swallowing.   Respiratory: Negative.   Cardiovascular: Negative.  Negative for palpitations.  Gastrointestinal: Negative for abdominal distention, abdominal pain, anal bleeding, blood in stool, constipation, diarrhea, nausea, rectal pain and vomiting.  Endocrine:  Positive for cold intolerance and polydipsia. Negative for heat intolerance, polyphagia and polyuria.  Genitourinary: Negative for menstrual problem.  Psychiatric/Behavioral: Negative for sleep disturbance. The patient is nervous/anxious.     Social History  Substance Use Topics  . Smoking status: Current Every Day Smoker    Packs/day: 0.50    Years: 4.00    Types: Cigarettes  . Smokeless tobacco: Never Used  . Alcohol use 0.5 oz/week    1 Standard drinks or equivalent per week     Comment: weekends   Objective:   BP 122/76 (BP Location: Left Arm, Patient Position: Sitting, Cuff Size: Large)   Pulse 80   Temp 98.5 F (36.9 C) (Oral)   Resp 16   Wt 128 lb (58.1 kg)   BMI 21.30 kg/m   Physical Exam  Constitutional: She is oriented to person, place, and time. She appears well-developed and well-nourished.  Neck: Neck supple. No thyromegaly present.  Cardiovascular: Normal rate, regular rhythm and normal heart sounds.   Pulmonary/Chest: Effort normal and breath sounds normal.  Lymphadenopathy:    She has no cervical adenopathy.  Neurological: She is alert and oriented to person, place, and time.  Skin: Skin is warm and dry.  Psychiatric: She has a normal mood and affect. Her behavior is normal.        Assessment & Plan:     1. Cold intolerance  Will check TSH as below. Will call with results.  - TSH  2. Hair loss  See above.   - TSH  Return if symptoms worsen or fail to improve.   Patient Instructions  Hypothyroidism Hypothyroidism is a disorder of the thyroid. The thyroid is a  large gland that is located in the lower front of the neck. The thyroid releases hormones that control how the body works. With hypothyroidism, the thyroid does not make enough of these hormones. What are the causes? Causes of hypothyroidism may include:  Viral infections.  Pregnancy.  Your own defense system (immune system) attacking your thyroid.  Certain medicines.  Birth  defects.  Past radiation treatments to your head or neck.  Past treatment with radioactive iodine.  Past surgical removal of part or all of your thyroid.  Problems with the gland that is located in the center of your brain (pituitary). What are the signs or symptoms? Signs and symptoms of hypothyroidism may include:  Feeling as though you have no energy (lethargy).  Inability to tolerate cold.  Weight gain that is not explained by a change in diet or exercise habits.  Dry skin.  Coarse hair.  Menstrual irregularity.  Slowing of thought processes.  Constipation.  Sadness or depression. How is this diagnosed? Your health care provider may diagnose hypothyroidism with blood tests and ultrasound tests. How is this treated? Hypothyroidism is treated with medicine that replaces the hormones that your body does not make. After you begin treatment, it may take several weeks for symptoms to go away. Follow these instructions at home:  Take medicines only as directed by your health care provider.  If you start taking any new medicines, tell your health care provider.  Keep all follow-up visits as directed by your health care provider. This is important. As your condition improves, your dosage needs may change. You will need to have blood tests regularly so that your health care provider can watch your condition. Contact a health care provider if:  Your symptoms do not get better with treatment.  You are taking thyroid replacement medicine and:  You sweat excessively.  You have tremors.  You feel anxious.  You lose weight rapidly.  You cannot tolerate heat.  You have emotional swings.  You have diarrhea.  You feel weak. Get help right away if:  You develop chest pain.  You develop an irregular heartbeat.  You develop a rapid heartbeat. This information is not intended to replace advice given to you by your health care provider. Make sure you discuss any  questions you have with your health care provider. Document Released: 07/25/2005 Document Revised: 12/31/2015 Document Reviewed: 12/10/2013 Elsevier Interactive Patient Education  2017 [employer]   The entirety of the information documented in the History of Present Illness, Review of Systems and Physical Exam were personally obtained by me. Portions of this information were initially documented by April M. Hyacinth MeekerMiller, CMA and reviewed by me for thoroughness and accuracy.        Trey SailorsAdriana M Pollak, PA-C  [facility]

## 2016-09-13 NOTE — Patient Instructions (Signed)
Hypothyroidism Hypothyroidism is a disorder of the thyroid. The thyroid is a large gland that is located in the lower front of the neck. The thyroid releases hormones that control how the body works. With hypothyroidism, the thyroid does not make enough of these hormones. What are the causes? Causes of hypothyroidism may include:  Viral infections.  Pregnancy.  Your own defense system (immune system) attacking your thyroid.  Certain medicines.  Birth defects.  Past radiation treatments to your head or neck.  Past treatment with radioactive iodine.  Past surgical removal of part or all of your thyroid.  Problems with the gland that is located in the center of your brain (pituitary).  What are the signs or symptoms? Signs and symptoms of hypothyroidism may include:  Feeling as though you have no energy (lethargy).  Inability to tolerate cold.  Weight gain that is not explained by a change in diet or exercise habits.  Dry skin.  Coarse hair.  Menstrual irregularity.  Slowing of thought processes.  Constipation.  Sadness or depression.  How is this diagnosed? Your health care provider may diagnose hypothyroidism with blood tests and ultrasound tests. How is this treated? Hypothyroidism is treated with medicine that replaces the hormones that your body does not make. After you begin treatment, it may take several weeks for symptoms to go away. Follow these instructions at home:  Take medicines only as directed by your health care provider.  If you start taking any new medicines, tell your health care provider.  Keep all follow-up visits as directed by your health care provider. This is important. As your condition improves, your dosage needs may change. You will need to have blood tests regularly so that your health care provider can watch your condition. Contact a health care provider if:  Your symptoms do not get better with treatment.  You are taking thyroid  replacement medicine and: ? You sweat excessively. ? You have tremors. ? You feel anxious. ? You lose weight rapidly. ? You cannot tolerate heat. ? You have emotional swings. ? You have diarrhea. ? You feel weak. Get help right away if:  You develop chest pain.  You develop an irregular heartbeat.  You develop a rapid heartbeat. This information is not intended to replace advice given to you by your health care provider. Make sure you discuss any questions you have with your health care provider. Document Released: 07/25/2005 Document Revised: 12/31/2015 Document Reviewed: 12/10/2013 Elsevier Interactive Patient Education  2017 Elsevier Inc.  

## 2016-09-14 LAB — TSH: TSH: 1.8 u[IU]/mL (ref 0.450–4.500)

## 2016-10-14 ENCOUNTER — Encounter: Payer: Self-pay | Admitting: *Deleted

## 2016-10-14 NOTE — Discharge Instructions (Signed)
[name] Vernie Murders, M.D. Davina Poke, M.D. Marion Downer, M.D. Bud Face, M.D.  Diet:   After surgery, the patient should take only liquids and foods as tolerated.  The patient may then have a regular diet after the effects of anesthesia have worn off, usually about four to six hours after surgery.  Activities:   The patient should rest until the effects of anesthesia have worn off.  After this, there are no restrictions on the normal daily activities.  Medications:   You will be given antibiotic drops to be used in the ears postoperatively.  It is recommended to use 4 drops 2 times a day for 4 days, then the drops should be saved for possible future use.  The tubes should not cause any discomfort to the patient, but if there is any question, Tylenol should be given according to the instructions for the age of the patient.  Other medications should be continued normally.  Precautions:   Should there be recurrent drainage after the tubes are placed, the drops should be used for approximately 4 days.  If it does not clear, you should call the ENT office.  Earplugs:   Earplugs are only needed for those who are going to be submerged under water.  When taking a bath or shower and using a cup or showerhead to rinse hair, it is not necessary to wear earplugs.  These come in a variety of fashions, all of which can be obtained at our office.  However, if one is not able to come by the office, then silicone plugs can be found at most pharmacies.  It is not advised to stick anything in the ear that is not approved as an earplug.  Silly putty is not to be used as an earplug.  Swimming is allowed in patients after ear tubes are inserted, however, they must wear earplugs if they are going to be submerged under water.  For those children who are going to be swimming a lot, it is  recommended to use a fitted ear mold, which can be made by our audiologist.  If discharge is noticed from the ears, this most likely represents an ear infection.  We would recommend getting your eardrops and using them as indicated above.  If it does not clear, then you should call the ENT office.  For follow up, the patient should return to the ENT office three weeks postoperatively and then every six months as required by the doctor.    General Anesthesia, Adult, Care After These instructions provide you with information about caring for yourself after your procedure. Your health care provider may also give you more specific instructions. Your treatment has been planned according to current medical practices, but problems sometimes occur. Call your health care provider if you have any problems or questions after your procedure. What can I expect after the procedure? After the procedure, it is common to have:  Vomiting.  A sore throat.  Mental slowness. It is common to feel:  Nauseous.  Cold or shivery.  Sleepy.  Tired.  Sore or achy, even in parts of your body where you did not have surgery. Follow these instructions at home: For at least 24 hours after the procedure:   Do not:  Participate in activities where you could fall or become injured.  Drive.  Use heavy machinery.  Drink alcohol.  Take sleeping pills or  medicines that cause drowsiness.  Make important decisions or sign legal documents.  Take care of children on your own.  Rest. Eating and drinking   If you vomit, drink water, juice, or soup when you can drink without vomiting.  Drink enough fluid to keep your urine clear or pale yellow.  Make sure you have little or no nausea before eating solid foods.  Follow the diet recommended by your health care provider. General instructions   Have a responsible adult stay with you until you are awake and alert.  Return to your normal activities as told by  your health care provider. Ask your health care provider what activities are safe for you.  Take over-the-counter and prescription medicines only as told by your health care provider.  If you smoke, do not smoke without supervision.  Keep all follow-up visits as told by your health care provider. This is important. Contact a health care provider if:  You continue to have nausea or vomiting at home, and medicines are not helpful.  You cannot drink fluids or start eating again.  You cannot urinate after 8-12 hours.  You develop a skin rash.  You have fever.  You have increasing redness at the site of your procedure. Get help right away if:  You have difficulty breathing.  You have chest pain.  You have unexpected bleeding.  You feel that you are having a life-threatening or urgent problem. This information is not intended to replace advice given to you by your health care provider. Make sure you discuss any questions you have with your health care provider. Document Released: 10/31/2000 Document Revised: 12/28/2015 Document Reviewed: 07/09/2015 Elsevier Interactive Patient Education  2017 [employer]

## 2016-10-19 ENCOUNTER — Ambulatory Visit: Payer: BLUE CROSS/BLUE SHIELD | Admitting: Anesthesiology

## 2016-10-19 ENCOUNTER — Encounter
Admission: RE | Disposition: A | Discharge status: Home / Self Care | Payer: Self-pay | Source: Ambulatory Visit | Attending: Otolaryngology

## 2016-10-19 ENCOUNTER — Ambulatory Visit
Admission: RE | Admit: 2016-10-19 | Discharge: 2016-10-19 | Disposition: A | Discharge status: Home / Self Care | Payer: BLUE CROSS/BLUE SHIELD | Source: Ambulatory Visit | Attending: Otolaryngology | Admitting: Otolaryngology

## 2016-10-19 DIAGNOSIS — H6983 Other specified disorders of Eustachian tube, bilateral: Secondary | ICD-10-CM | POA: Insufficient documentation

## 2016-10-19 DIAGNOSIS — J301 Allergic rhinitis due to pollen: Secondary | ICD-10-CM | POA: Insufficient documentation

## 2016-10-19 DIAGNOSIS — F17200 Nicotine dependence, unspecified, uncomplicated: Secondary | ICD-10-CM | POA: Diagnosis not present

## 2016-10-19 HISTORY — DX: Adverse effect of unspecified anesthetic, initial encounter: T41.45XA

## 2016-10-19 HISTORY — DX: Family history of other specified conditions: Z84.89

## 2016-10-19 HISTORY — DX: Other complications of anesthesia, initial encounter: T88.59XA

## 2016-10-19 HISTORY — PX: MYRINGOTOMY WITH TUBE PLACEMENT: SHX5663

## 2016-10-19 SURGERY — MYRINGOTOMY WITH TUBE PLACEMENT
Anesthesia: General | Site: Ear | Laterality: Bilateral | Wound class: Dirty or Infected

## 2016-10-19 MED ORDER — LACTATED RINGERS IV SOLN
INTRAVENOUS | Status: DC
  Administered 2016-10-19: 08:00:00 via INTRAVENOUS

## 2016-10-19 MED ORDER — ONDANSETRON HCL 4 MG/2ML IJ SOLN: 4.0000 mg | Freq: Once | INTRAMUSCULAR | Status: DC | PRN

## 2016-10-19 MED ORDER — MIDAZOLAM HCL 5 MG/5ML IJ SOLN
INTRAMUSCULAR | Status: DC | PRN
  Administered 2016-10-19: 2 mg via INTRAVENOUS

## 2016-10-19 MED ORDER — OXYCODONE HCL 5 MG PO TABS: 5.0000 mg | ORAL_TABLET | Freq: Once | ORAL | Status: DC | PRN

## 2016-10-19 MED ORDER — ONDANSETRON HCL 4 MG PO TABS: 4.0000 mg | ORAL_TABLET | Freq: Three times a day (TID) | ORAL | 0 refills | Status: DC | PRN

## 2016-10-19 MED ORDER — ACETAMINOPHEN 325 MG PO TABS
650.0000 mg | ORAL_TABLET | Freq: Once | ORAL | Status: AC
  Administered 2016-10-19: 650 mg via ORAL

## 2016-10-19 MED ORDER — CIPROFLOXACIN-DEXAMETHASONE 0.3-0.1 % OT SUSP
OTIC | Status: DC | PRN
  Administered 2016-10-19: 4 [drp] via OTIC

## 2016-10-19 MED ORDER — PROPOFOL 10 MG/ML IV BOLUS
INTRAVENOUS | Status: DC | PRN
  Administered 2016-10-19: 100 mg via INTRAVENOUS

## 2016-10-19 MED ORDER — FENTANYL CITRATE (PF) 100 MCG/2ML IJ SOLN: 25.0000 ug | INTRAMUSCULAR | Status: DC | PRN

## 2016-10-19 MED ORDER — OXYCODONE HCL 5 MG/5ML PO SOLN: 5.0000 mg | Freq: Once | ORAL | Status: DC | PRN

## 2016-10-19 MED ORDER — GLYCOPYRROLATE 0.2 MG/ML IJ SOLN
INTRAMUSCULAR | Status: DC | PRN
  Administered 2016-10-19: .1 mg via INTRAVENOUS

## 2016-10-19 SURGICAL SUPPLY — 12 items
BALL CTTN LRG ABS STRL LF (GAUZE/BANDAGES/DRESSINGS) ×1
BLADE MYR LANCE NRW W/HDL (BLADE) ×2 IMPLANT
CANISTER SUCT 1200ML W/VALVE (MISCELLANEOUS) ×2 IMPLANT
COTTONBALL LRG STERILE PKG (GAUZE/BANDAGES/DRESSINGS) ×2 IMPLANT
GLOVE BIO SURGEON STRL SZ7.5 (GLOVE) ×4 IMPLANT
STRAP BODY AND KNEE 60X3 (MISCELLANEOUS) ×2 IMPLANT
TOWEL OR 17X26 4PK STRL BLUE (TOWEL DISPOSABLE) ×2 IMPLANT
TUBE EAR ARMSTRONG HC 1.14X3.5 (OTOLOGIC RELATED) ×4 IMPLANT
TUBE EAR T 1.27X4.5 GO LF (OTOLOGIC RELATED) IMPLANT
TUBE EAR T 1.27X5.3 BFLY (OTOLOGIC RELATED) IMPLANT
TUBING CONN 6MMX3.1M (TUBING) ×1
TUBING SUCTION CONN 0.25 STRL (TUBING) ×1 IMPLANT

## 2016-10-19 NOTE — H&P (Signed)
..  History and Physical paper copy reviewed and updated date of procedure and will be scanned into system.  Patient seen and examined.  

## 2016-10-19 NOTE — Anesthesia Postprocedure Evaluation (Signed)
Anesthesia Post Note  [name]  Procedure(s) Performed: Procedure(s) (LRB): MYRINGOTOMY WITH TUBE PLACEMENT  butterfly tubes (Bilateral)  Patient location during evaluation: PACU Anesthesia Type: General Level of consciousness: awake and alert and oriented Pain management: satisfactory to patient Vital Signs Assessment: post-procedure vital signs reviewed and stable Respiratory status: spontaneous breathing, nonlabored ventilation and respiratory function stable Cardiovascular status: blood pressure returned to baseline and stable Postop Assessment: Adequate PO intake and No signs of nausea or vomiting Anesthetic complications: no    Cherly BeachStella, [name] [name]

## 2016-10-19 NOTE — Transfer of Care (Signed)
Immediate Anesthesia Transfer of Care Note  [name]  Procedure(s) Performed: Procedure(s) with comments: MYRINGOTOMY WITH TUBE PLACEMENT  butterfly tubes (Bilateral) - butterfly tubes  Patient Location: PACU  Anesthesia Type: General  Level of Consciousness: awake, alert  and patient cooperative  Airway and Oxygen Therapy: Patient Spontanous Breathing and Patient connected to supplemental oxygen  Post-op Assessment: Post-op Vital signs reviewed, Patient's Cardiovascular Status Stable, Respiratory Function Stable, Patent Airway and No signs of Nausea or vomiting  Post-op Vital Signs: Reviewed and stable  Complications: No apparent anesthesia complications

## 2016-10-19 NOTE — Anesthesia Preprocedure Evaluation (Signed)
Anesthesia Evaluation  Patient identified by MRN, date of birth, ID band Patient awake    Reviewed: Allergy & Precautions, H&P , NPO status , Patient's Chart, lab work & pertinent test results  Airway Mallampati: II  TM Distance: >3 FB Neck ROM: full    Dental no notable dental hx.    Pulmonary Current Smoker,    Pulmonary exam normal        Cardiovascular Normal cardiovascular exam     Neuro/Psych    GI/Hepatic   Endo/Other    Renal/GU      Musculoskeletal   Abdominal   Peds  Hematology   Anesthesia Other Findings   Reproductive/Obstetrics                             Anesthesia Physical Anesthesia Plan  ASA: I  Anesthesia Plan: General   Post-op Pain Management:    Induction:   Airway Management Planned: Mask  Additional Equipment:   Intra-op Plan:   Post-operative Plan:   Informed Consent: I have reviewed the patients History and Physical, chart, labs and discussed the procedure including the risks, benefits and alternatives for the proposed anesthesia with the patient or authorized representative who has indicated his/her understanding and acceptance.     Plan Discussed with:   Anesthesia Plan Comments:         Anesthesia Quick Evaluation

## 2016-10-19 NOTE — Op Note (Signed)
..  10/19/2016  8:44 AM    [name] [name], [name] [name]  [id number]   Pre-Op Dx:  chronic eustacian tube dysfunction  Post-op Dx: chronic eustacian tube dysfunction  Proc:Bilateral myringotomy with BUTTERFLY tubes  Surg: Cyerra Yim  Anes:  General by mask  EBL:  None  Comp:  None  Findings:  ButterflyTubes placed anterior-inferior on right and posterior inferior on left.  Procedure: With the patient in a comfortable supine position, general mask anesthesia was administered.  At an appropriate level, microscope and speculum were used to examine and clean the RIGHT ear canal.  The findings were as described above.  An anterior inferior radial myringotomy incision was sharply executed.  Middle ear contents were suctioned clear with a size 5 otologic suction.  A Butterfly PE tube was placed without difficulty using a Rosen Architectpick and alligator.  Ciprodex otic solution was instilled into the external canal, and insufflated into the middle ear.  A cotton ball was placed at the external meatus. Hemostasis was observed.  This side was completed.  After completing the RIGHT side, the LEFT side was done in identical fashion.    Following this  The patient was returned to anesthesia, awakened, and transferred to recovery in stable condition.  Dispo:  PACU to home  Plan: Routine drop use and water precautions.  Recheck my office three weeks.   Lavel Rieman 8:44 AM 10/19/2016

## 2016-10-19 NOTE — Anesthesia Procedure Notes (Signed)
Performed by: Irmalee Riemenschneider Pre-anesthesia Checklist: Patient identified, Emergency Drugs available, Suction available, Timeout performed and Patient being monitored Patient Re-evaluated:Patient Re-evaluated prior to inductionOxygen Delivery Method: Circle system utilized Preoxygenation: Pre-oxygenation with 100% oxygen Intubation Type: IV induction Ventilation: Mask ventilation without difficulty and Mask ventilation throughout procedure Dental Injury: Teeth and Oropharynx as per pre-operative assessment         

## 2016-10-20 ENCOUNTER — Encounter: Payer: Self-pay | Admitting: Otolaryngology

## 2017-01-09 ENCOUNTER — Telehealth: Payer: Self-pay

## 2017-01-09 NOTE — Telephone Encounter (Signed)
Pt calling wondering if she could p/u records of surgeries and appt records.  Left detailed msg that she would need to fill out a release of information either in person or online from our website.  Also adv depending how many pages it as to the cost.  [phone]

## 2017-03-06 ENCOUNTER — Encounter: Payer: Self-pay | Admitting: Family Medicine

## 2017-03-06 ENCOUNTER — Ambulatory Visit (INDEPENDENT_AMBULATORY_CARE_PROVIDER_SITE_OTHER): Payer: BLUE CROSS/BLUE SHIELD | Admitting: Family Medicine

## 2017-03-06 VITALS — BP 110/80 | HR 110 | Temp 97.5°F | Resp 16 | Wt 120.4 lb

## 2017-03-06 DIAGNOSIS — J0100 Acute maxillary sinusitis, unspecified: Secondary | ICD-10-CM

## 2017-03-06 MED ORDER — AMOXICILLIN-POT CLAVULANATE 875-125 MG PO TABS: 1.0000 | ORAL_TABLET | Freq: Two times a day (BID) | ORAL | 0 refills | Status: DC

## 2017-03-06 NOTE — Progress Notes (Signed)
Subjective:     Patient ID: [name] [name], female   DOB: [birth year], 26 y.o.   MRN: [id number]  HPI  Chief Complaint  Patient presents with  . Sinus Problem    Patient comes in office today with complaints of sinus pain and pressure for the past week and half. Patient reports productive cough with green mucous, ear pain/pressure, itchy throat, congestion/runny nose and muscle aches. Patient has been taking otc Day/Nyquil.   States she works in a [employer] and has been exposed to kids with colds. + tobacco use. Has has tympanostomy tube placement in March.   Review of Systems     Objective:   Physical Exam  Constitutional: She appears well-developed and well-nourished. No distress.  Ears: T.M's intact without inflammation/Tymp.tubes present Sinuses: mild maxillary sinus tenderness Throat: no tonsillar enlargement or exudate Neck: no cervical adenopathy Lungs: clear     Assessment:    1. Acute non-recurrent maxillary sinusitis - amoxicillin-clavulanate (AUGMENTIN) 875-125 MG tablet; Take 1 tablet by mouth 2 (two) times daily.  Dispense: 20 tablet; Refill: 0    Plan:    Discussed use of Mucinex D and Delsym.

## 2017-03-06 NOTE — Patient Instructions (Signed)
Discussed use of Mucinex D for congestion and Delsym for cough. Minimize smoking while ill.

## 2017-05-12 IMAGING — US US OB TRANSVAGINAL
1 series · 15 of 28 positions shown · non-contrast
Comparison: None.

CLINICAL DATA: Right lower quadrant pain, known ectopic pregnancy

EXAM:
TRANSVAGINAL OB ULTRASOUND
TECHNIQUE: Transvaginal ultrasound was performed for complete evaluation of the
gestation as well as the maternal uterus, adnexal regions, and
pelvic cul-de-sac.

[Series 1: us ob transvaginal · 63 acquisitions, 15 frames shown]
[im 1/63]
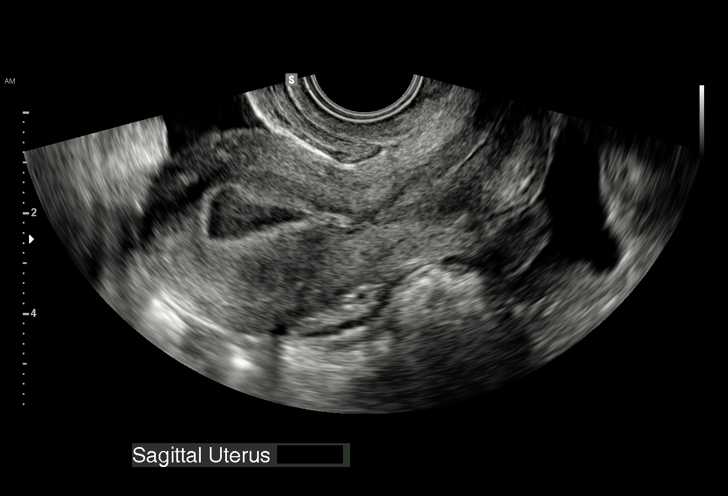
[im 5/63]
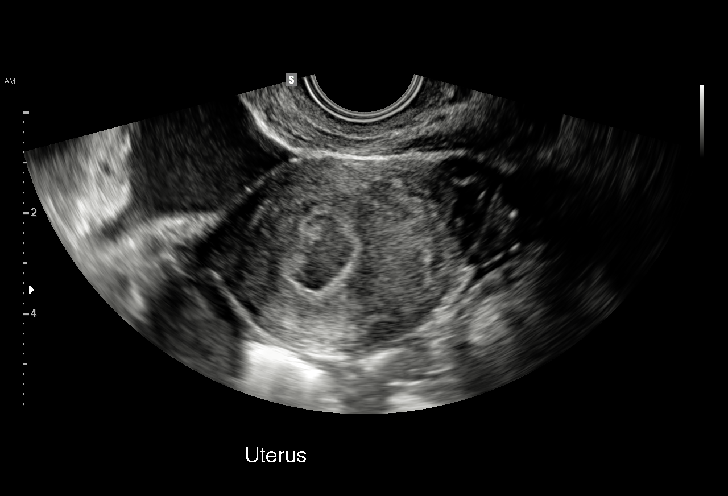
[im 10/63]
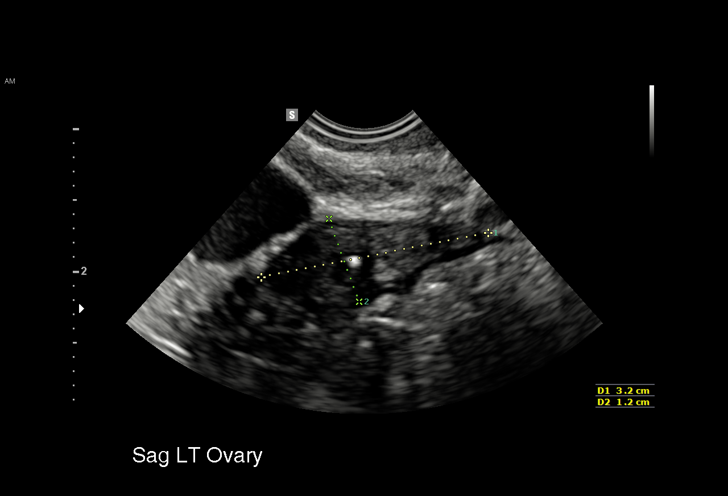
[im 14/63]
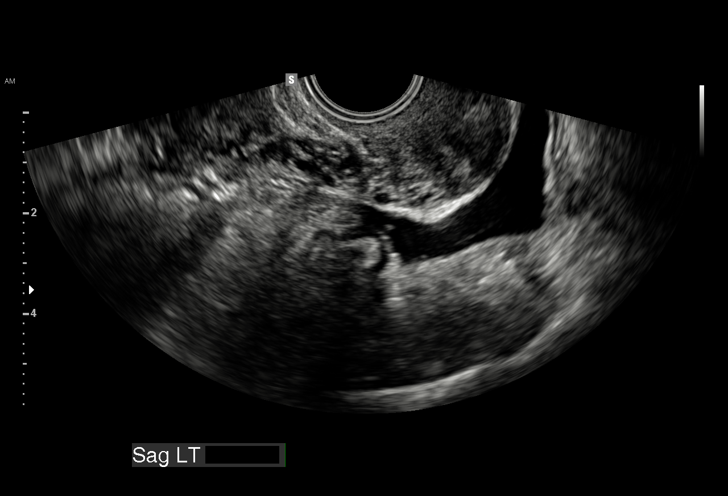
[im 19/63]
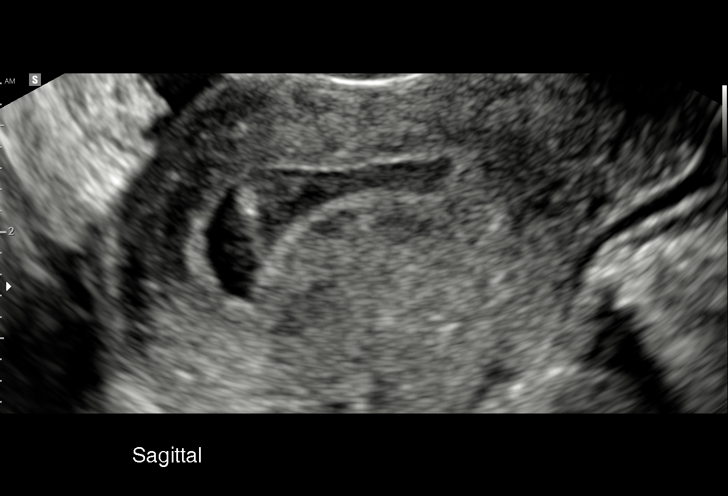
[im 23/63]
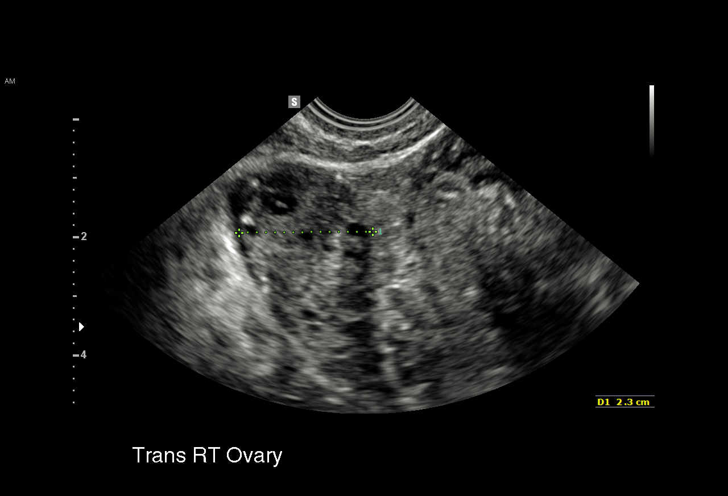
[im 28/63]
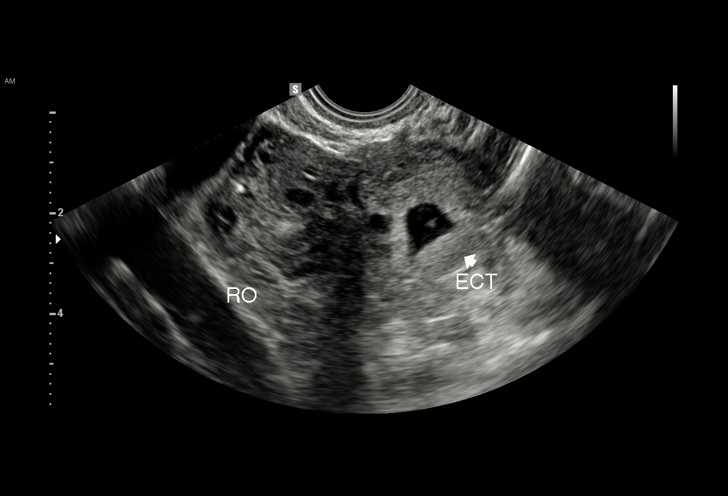
[im 33/63]
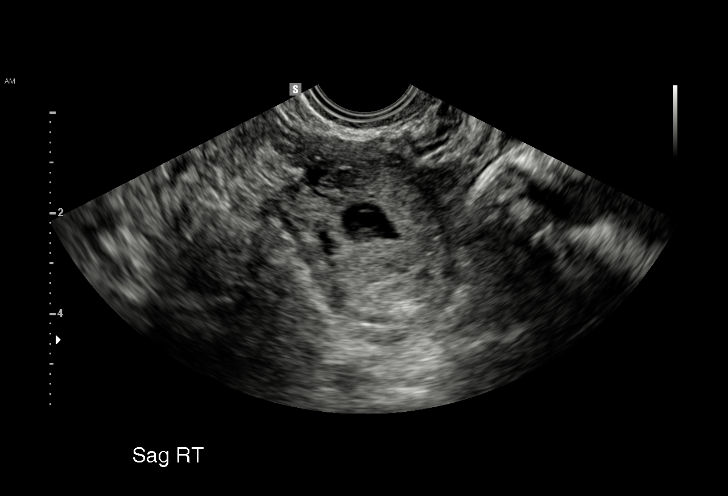
[im 35/63]
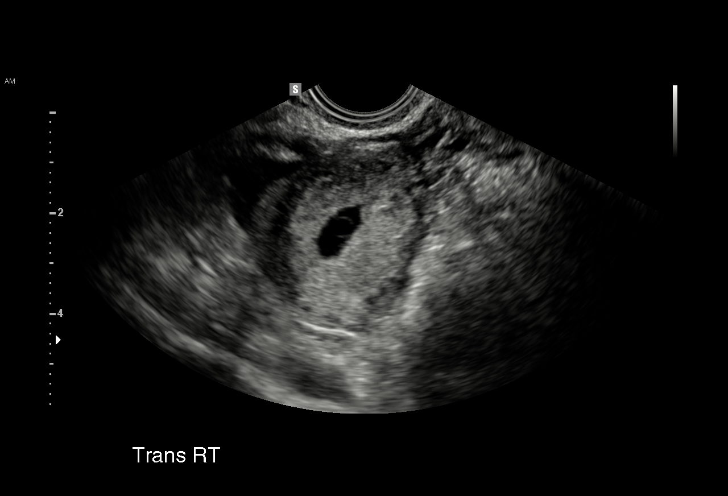
[im 40/63]
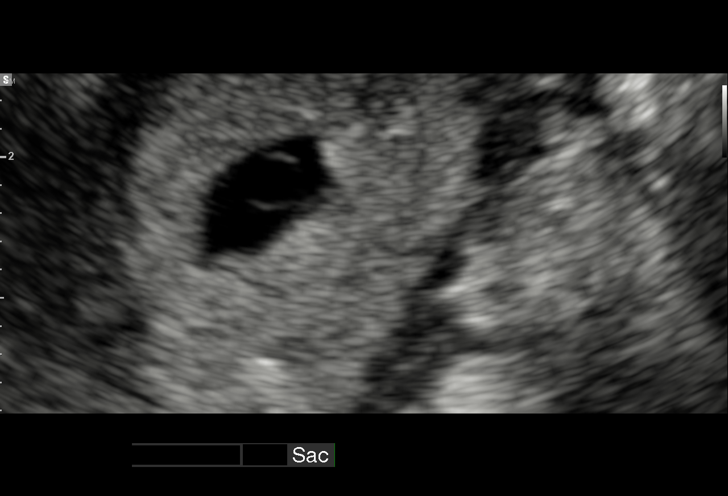
[im 44/63]
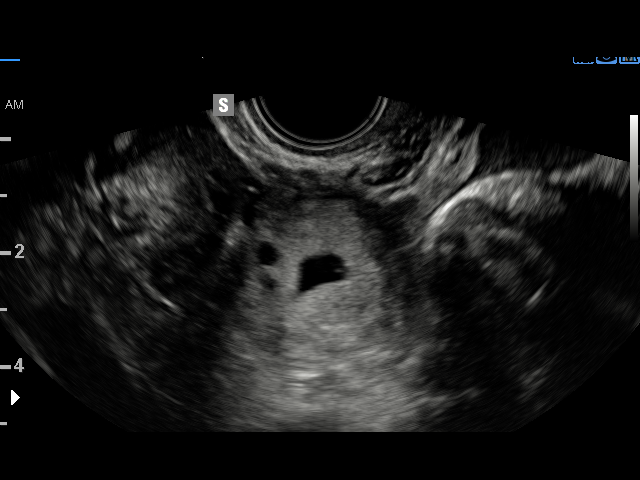
[im 49/63]
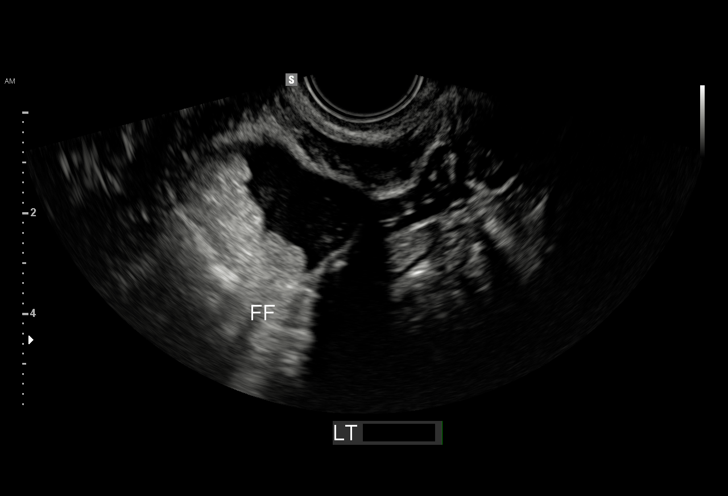
[im 53/63]
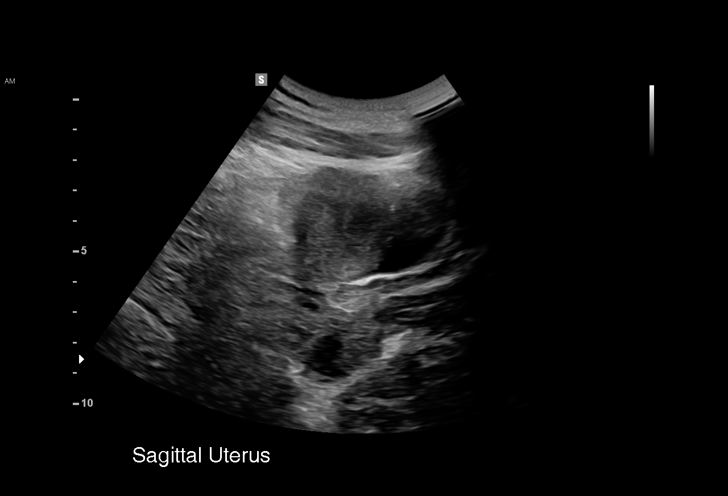
[im 58/63]
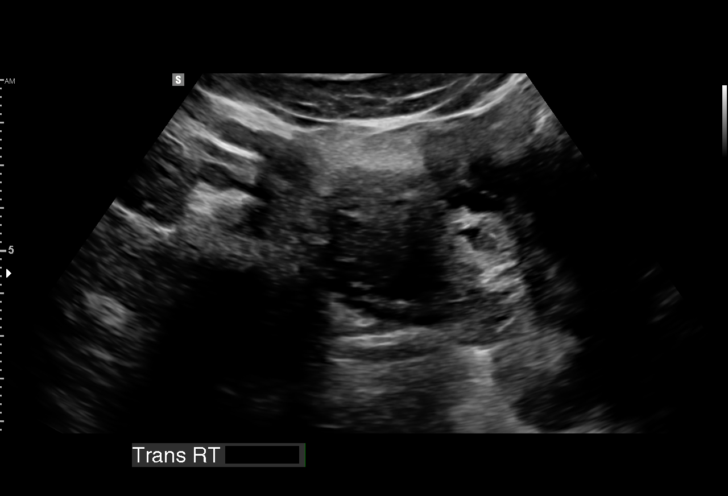
[im 63/63]
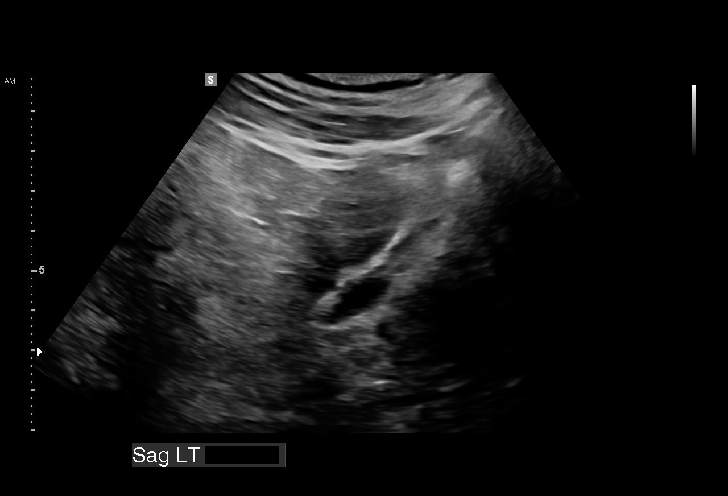

[15 of 28 positions shown; findings below may reference images not displayed]

FINDINGS: Intrauterine gestational sac: None

Yolk sac:  Present in the right adnexa

Embryo:  Present in the right adnexa

Cardiac Activity: Present

Heart Rate: 137 bpm

CRL:   2.2  mm   5 w 5 d

Subchorionic hemorrhage:  None visualized.

Maternal uterus/adnexae: 3.2 x 4.4 x 3.7 cm right adnexal mass
adjacent to the right ovary, with associated gestational sac, yolk
sac, and fetal pole, with measurements as above. This appearance is
compatible with biopsy right adnexal ectopic pregnancy.

Small to moderate pelvic ascites, mildly complex.
IMPRESSION: No evidence of intrauterine gestation.

Single live right adnexal ectopic pregnancy, measuring 5 weeks 5
days by crown-rump length.

Small symmetric pelvic ascites, mildly complex.

Critical Value/emergent results were called by telephone at the time
of interpretation on 02/19/2016 at [DATE] to RUDI JUMPER , who
verbally acknowledged these results.

## 2017-05-29 ENCOUNTER — Encounter: Payer: Self-pay | Admitting: Physician Assistant

## 2017-05-29 ENCOUNTER — Ambulatory Visit (INDEPENDENT_AMBULATORY_CARE_PROVIDER_SITE_OTHER): Payer: BLUE CROSS/BLUE SHIELD | Admitting: Physician Assistant

## 2017-05-29 VITALS — BP 110/72 | HR 80 | Temp 98.2°F | Resp 16 | Wt 123.0 lb

## 2017-05-29 DIAGNOSIS — J0101 Acute recurrent maxillary sinusitis: Secondary | ICD-10-CM

## 2017-05-29 DIAGNOSIS — Z720 Tobacco use: Secondary | ICD-10-CM | POA: Diagnosis not present

## 2017-05-29 MED ORDER — AMOXICILLIN-POT CLAVULANATE 875-125 MG PO TABS: 1.0000 | ORAL_TABLET | Freq: Two times a day (BID) | ORAL | 0 refills | Status: AC

## 2017-05-29 MED ORDER — BUPROPION HCL ER (SR) 150 MG PO TB12: ORAL_TABLET | ORAL | 0 refills | Status: DC

## 2017-05-29 NOTE — Progress Notes (Signed)
[name] Female    DOB: Feb 08, 1991   26 y.o.   MRN: [id number] Visit Date: 05/29/2017  Today's Provider: Trey SailorsAdriana M Pollak, PA-C   Chief Complaint  Patient presents with  . Sinusitis    Started last night   Subjective:    [name] [name] is 26 year old woman with history of tobacco abuse, recurrent sinusitis and otitis media s/p tympanostomy tube placement in 10/2016 presenting today with sinusitis. Symptoms of nasal congestion x 1 day. Purulent rhinorrhea. Smoking 0.5 pack per day, interested in quitting.  Sinusitis  This is a new problem. The current episode started yesterday. The problem has been gradually worsening since onset. There has been no fever. Associated symptoms include congestion, coughing, headaches, a hoarse voice, sinus pressure and swollen glands. Pertinent negatives include no chills, diaphoresis, ear pain, neck pain, shortness of breath, sneezing or sore throat. The treatment provided no relief.      No Known Allergies   Current Outpatient Prescriptions:  .  Azelastine-Fluticasone (DYMISTA) 137-50 MCG/ACT SUSP, Place into the nose., Disp: , Rfl:  .  montelukast (SINGULAIR) 10 MG tablet, Take 10 mg by mouth at bedtime., Disp: , Rfl:  .  Multiple Vitamins-Minerals (HAIR SKIN AND NAILS FORMULA PO), Take by mouth daily., Disp: , Rfl:   Review of Systems  Constitutional: Positive for fatigue. Negative for activity change, appetite change, chills, diaphoresis, fever and unexpected weight change.  HENT: Positive for congestion, hoarse voice, sinus pressure and voice change. Negative for drooling, ear discharge, ear pain, nosebleeds, postnasal drip, sinus pain, sneezing, sore throat, tinnitus and trouble swallowing.   Respiratory: Positive for cough. Negative for apnea, choking, chest tightness, shortness of breath, wheezing and stridor.   Gastrointestinal: Negative.   Musculoskeletal: Negative for neck pain.  Neurological: Positive for headaches.     Social History  Substance Use Topics  . Smoking status: Current Every Day Smoker    Packs/day: 0.50    Years: 4.00    Types: Cigarettes  . Smokeless tobacco: Never Used  . Alcohol use 3.0 oz/week    1 Standard drinks or equivalent, 4 Cans of beer per week     Comment: weekends   Objective:   BP 110/72 (BP Location: Left Arm, Patient Position: Sitting, Cuff Size: Normal)   Pulse 80   Temp 98.2 F (36.8 C) (Oral)   Resp 16   Wt 123 lb (55.8 kg)   LMP 05/01/2017   SpO2 99%   BMI 20.47 kg/m  Vitals:   05/29/17 1501  BP: 110/72  Pulse: 80  Resp: 16  Temp: 98.2 F (36.8 C)  TempSrc: Oral  SpO2: 99%  Weight: 123 lb (55.8 kg)     Physical Exam  Constitutional: She is oriented to person, place, and time. She appears well-developed and well-nourished. No distress.  HENT:  Right Ear: External ear normal.  Left Ear: External ear normal.  Nose: Right sinus exhibits maxillary sinus tenderness and frontal sinus tenderness. Left sinus exhibits maxillary sinus tenderness and frontal sinus tenderness.  Mouth/Throat: Oropharynx is clear and moist. No oropharyngeal exudate, posterior oropharyngeal edema or posterior oropharyngeal erythema.  Tms opaque bilaterally   Eyes: Conjunctivae are normal. Right eye exhibits no discharge. Left eye exhibits no discharge.  Neck: Neck supple.  Cardiovascular: Normal rate and regular rhythm.   Pulmonary/Chest: Effort normal and breath sounds normal.  Lymphadenopathy:    She has cervical adenopathy.  Neurological: She is alert and oriented to person, place, and  time.  Skin: Skin is warm and dry. She is not diaphoretic.  Psychiatric: She has a normal mood and affect. Her behavior is normal.        Assessment & Plan:     1. Acute recurrent maxillary sinusitis  Recurrent sinusitis. Talked about potentially delaying antibiotics, but she does have significant history of infection.  - amoxicillin-clavulanate (AUGMENTIN) 875-125 MG tablet;  Take 1 tablet by mouth 2 (two) times daily.  Dispense: 20 tablet; Refill: 0  2. Tobacco abuse  Talked about importance of smoking cessation. Will try wellbutrin.   - buPROPion (WELLBUTRIN SR) 150 MG 12 hr tablet; Take one pill in the morning for the first three days. On day 4, take on pill twice daily for remainder.  Dispense: 180 tablet; Refill: 0  Return if symptoms worsen or fail to improve.  The entirety of the information documented in the History of Present Illness, Review of Systems and Physical Exam were personally obtained by me. Portions of this information were initially documented by Kavin Leech, CMA and reviewed by me for thoroughness and accuracy.        Trey Sailors, PA-C  [facility]

## 2017-05-29 NOTE — Patient Instructions (Signed)

## 2017-06-27 ENCOUNTER — Encounter: Payer: Self-pay | Admitting: Family Medicine

## 2017-06-27 ENCOUNTER — Ambulatory Visit: Payer: BLUE CROSS/BLUE SHIELD | Admitting: Family Medicine

## 2017-06-27 VITALS — BP 102/72 | HR 88 | Temp 98.4°F | Wt 120.6 lb

## 2017-06-27 DIAGNOSIS — J039 Acute tonsillitis, unspecified: Secondary | ICD-10-CM | POA: Diagnosis not present

## 2017-06-27 LAB — POCT RAPID STREP A (OFFICE): Rapid Strep A Screen: NEGATIVE

## 2017-06-27 MED ORDER — AMOXICILLIN 875 MG PO TABS: 875.0000 mg | ORAL_TABLET | Freq: Two times a day (BID) | ORAL | 0 refills | Status: DC

## 2017-06-27 NOTE — Patient Instructions (Signed)
Tonsillitis Tonsillitis is an infection of the throat that causes the tonsils to become red, tender, and swollen. Tonsils are collections of lymphoid tissue at the back of the throat. Each tonsil has crevices (crypts). Tonsils help fight nose and throat infections and keep infection from spreading to other parts of the body for the first 18 months of life. What are the causes? Sudden (acute) tonsillitis is usually caused by infection with streptococcal bacteria. Long-lasting (chronic) tonsillitis occurs when the crypts of the tonsils become filled with pieces of food and bacteria, which makes it easy for the tonsils to become repeatedly infected. What are the signs or symptoms? Symptoms of tonsillitis include:  A sore throat, with possible difficulty swallowing.  White patches on the tonsils.  Fever.  Tiredness.  New episodes of snoring during sleep, when you did not snore before.  Small, foul-smelling, yellowish-white pieces of material (tonsilloliths) that you occasionally cough up or spit out. The tonsilloliths can also cause you to have bad breath.  How is this diagnosed? Tonsillitis can be diagnosed through a physical exam. Diagnosis can be confirmed with the results of lab tests, including a throat culture. How is this treated? The goals of tonsillitis treatment include the reduction of the severity and duration of symptoms and prevention of associated conditions. Symptoms of tonsillitis can be improved with the use of steroids to reduce the swelling. Tonsillitis caused by bacteria can be treated with antibiotic medicines. Usually, treatment with antibiotic medicines is started before the cause of the tonsillitis is known. However, if it is determined that the cause is not bacterial, antibiotic medicines will not treat the tonsillitis. If attacks of tonsillitis are severe and frequent, your health care provider may recommend surgery to remove the tonsils (tonsillectomy). Follow these  instructions at home:  Rest as much as possible and get plenty of sleep.  Drink plenty of fluids. While the throat is very sore, eat soft foods or liquids, such as sherbet, soups, or instant breakfast drinks.  Eat frozen ice pops.  Gargle with a warm or cold liquid to help soothe the throat. Mix 1/4 teaspoon of salt and 1/4 teaspoon of baking soda in 8 oz of water. Contact a health care provider if:  Large, tender lumps develop in your neck.  A rash develops.  A green, yellow-brown, or bloody substance is coughed up.  You are unable to swallow liquids or food for 24 hours.  You notice that only one of the tonsils is swollen. Get help right away if:  You develop any new symptoms such as vomiting, severe headache, stiff neck, chest pain, or trouble breathing or swallowing.  You have severe throat pain along with drooling or voice changes.  You have severe pain, unrelieved with recommended medications.  You are unable to fully open the mouth.  You develop redness, swelling, or severe pain anywhere in the neck.  You have a fever. This information is not intended to replace advice given to you by your health care provider. Make sure you discuss any questions you have with your health care provider. Document Released: 05/04/2005 Document Revised: 12/31/2015 Document Reviewed: 01/11/2013 Elsevier Interactive Patient Education  2017 Elsevier Inc.  

## 2017-06-27 NOTE — Progress Notes (Signed)
[name] Female    DOB: Jul 18, 1991   26 y.o.   MRN: [id number] Visit Date: 06/27/2017  Today's Provider: Dortha Kernennis Shenea Giacobbe, PA   Chief Complaint  Patient presents with  . Sore Throat  . Fever   Subjective:    Sore Throat   This is a new problem. The current episode started yesterday. The problem has been gradually worsening. The pain is worse on the right side. The maximum temperature recorded prior to her arrival was 100.4 - 100.9 F. Associated symptoms include headaches. She has had exposure to strep. 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 She has tried nothing for the symptoms.   Past Medical History:  Diagnosis Date  . Complication of anesthesia    slow to wake  . Ectopic pregnancy   . Family history of adverse reaction to anesthesia    Mother - slow to wake  . Infection    UTI  . Ovarian cyst    Past Surgical History:  Procedure Laterality Date  . HERNIA REPAIR    . KNEE SURGERY Right   . LAPAROSCOPY    . LAPAROSCOPY N/A 02/19/2016   Procedure: LAPAROSCOPY DIAGNOSTIC WITH RIGHT SALPINGECTOMY;  Surgeon: Mitchel HonourMegan Morris, DO;  Location: [facility];  Service: Gynecology;  Laterality: N/A;  . MYRINGOTOMY WITH TUBE PLACEMENT Bilateral 10/19/2016   Procedure: MYRINGOTOMY WITH TUBE PLACEMENT  butterfly tubes;  Surgeon: Bud Facereighton Vaught, MD;  [facility];  Service: ENT;  Laterality: Bilateral;  butterfly tubes  . WISDOM TOOTH EXTRACTION     Family History  Problem Relation Age of Onset  . Diabetes Maternal Grandfather   . Heart disease Paternal Grandmother   . Hyperlipidemia Paternal Grandmother   . Stroke Paternal Grandmother   . Heart disease Paternal Grandfather   . Hyperlipidemia Paternal Grandfather   . COPD Paternal Grandfather   . Healthy Mother   . Hypertension Mother   . Healthy Father   . Hypertension Father    No Known Allergies  Current Outpatient Medications:  .  Azelastine-Fluticasone (DYMISTA) 137-50 MCG/ACT SUSP, Place into the  nose., Disp: , Rfl:  .  buPROPion (WELLBUTRIN SR) 150 MG 12 hr tablet, Take one pill in the morning for the first three days. On day 4, take on pill twice daily for remainder., Disp: 180 tablet, Rfl: 0 .  montelukast (SINGULAIR) 10 MG tablet, Take 10 mg by mouth at bedtime., Disp: , Rfl:  .  Multiple Vitamins-Minerals (HAIR SKIN AND NAILS FORMULA PO), Take by mouth daily., Disp: , Rfl:   Review of Systems  Constitutional: Positive for fever.  HENT: Positive for sore throat.   Respiratory: Negative.   Cardiovascular: Negative.   Neurological: Positive for headaches.    Social History   Tobacco Use  . Smoking status: Current Every Day Smoker    Packs/day: 0.50    Years: 4.00    Pack years: 2.00    Types: Cigarettes  . Smokeless tobacco: Never Used  Substance Use Topics  . Alcohol use: Yes    Alcohol/week: 3.0 oz    Types: 1 Standard drinks or equivalent, 4 Cans of beer per week    Comment: weekends   Objective:   BP 102/72 (BP Location: Right Arm, Patient Position: Sitting, Cuff Size: Normal)   Pulse 88   Temp 98.4 F (36.9 C) (Oral)   Wt 120 lb 9.6 oz (54.7 kg)   SpO2 97%   BMI 20.07 kg/m    Physical Exam  Constitutional: She  is oriented to person, place, and time. She appears well-developed and well-nourished.  HENT:  Head: Normocephalic.  Nose: Nose normal.  Blue tubes in both ears stable. No drainage or erythema. Right tonsil enlarged, red and has some exudate bilaterally. Right submandibular node is 3+ enlarged and tender.  Eyes: Conjunctivae are normal.  Neck: Neck supple.  Cardiovascular: Normal rate and regular rhythm.  Pulmonary/Chest: Effort normal and breath sounds normal.  Abdominal: Soft. Bowel sounds are normal.  Lymphadenopathy:    She has cervical adenopathy.  Neurological: She is alert and oriented to person, place, and time.  Skin: No rash noted.      Assessment & Plan:     1. Exudative tonsillitis Developed sore throat and fever yesterday.  Enlarged right tonsil with redness and bilateral exudates. No fever now and strep test negative. Will treat with antibiotic, warm saltwater gargles prn and Tylenol or Advil prn. Increase fluid intake and out of work 06-28-17 through 07-03-17. Recheck prn. - POCT rapid strep A - amoxicillin (AMOXIL) 875 MG tablet; Take 1 tablet (875 mg total) by mouth 2 (two) times daily.  Dispense: 20 tablet; Refill: 0       Dortha Kernennis Kin Galbraith, PA  [facility]

## 2017-08-29 ENCOUNTER — Ambulatory Visit
Admission: RE | Admit: 2017-08-29 | Discharge: 2017-08-29 | Disposition: A | Discharge status: Home / Self Care | Payer: BLUE CROSS/BLUE SHIELD | Source: Ambulatory Visit | Attending: Physician Assistant | Admitting: Physician Assistant

## 2017-08-29 ENCOUNTER — Ambulatory Visit: Payer: BLUE CROSS/BLUE SHIELD | Admitting: Physician Assistant

## 2017-08-29 ENCOUNTER — Encounter: Payer: Self-pay | Admitting: Physician Assistant

## 2017-08-29 VITALS — BP 134/86 | HR 80 | Temp 97.9°F | Resp 16 | Wt 125.0 lb

## 2017-08-29 DIAGNOSIS — R509 Fever, unspecified: Secondary | ICD-10-CM

## 2017-08-29 DIAGNOSIS — N83201 Unspecified ovarian cyst, right side: Secondary | ICD-10-CM | POA: Insufficient documentation

## 2017-08-29 DIAGNOSIS — R1032 Left lower quadrant pain: Secondary | ICD-10-CM | POA: Diagnosis not present

## 2017-08-29 LAB — POCT URINALYSIS DIPSTICK
Bilirubin, UA: NEGATIVE
Blood, UA: NEGATIVE
Glucose, UA: NEGATIVE
Ketones, UA: NEGATIVE
Leukocytes, UA: NEGATIVE
Nitrite, UA: NEGATIVE
Protein, UA: NEGATIVE
Spec Grav, UA: <=1.005 — AB (ref 1.010–1.025)
Urobilinogen, UA: 0.2 E.U./dL
pH, UA: 8 (ref 5.0–8.0)

## 2017-08-29 LAB — POCT URINE PREGNANCY: Preg Test, Ur: NEGATIVE

## 2017-08-29 MED ORDER — IOPAMIDOL (ISOVUE-300) INJECTION 61%
100.0000 mL | Freq: Once | INTRAVENOUS | Status: AC | PRN
  Administered 2017-08-29: 100 mL via INTRAVENOUS

## 2017-08-29 NOTE — Progress Notes (Signed)
[name] Female    DOB: July 02, 1991   27 y.o.   MRN: [id number] Visit Date: 08/29/2017  Today's Provider: Trinna Post, PA-C   Chief Complaint  Patient presents with  . Abdominal Pain    Lower left side; has been going on for two weeks.    Subjective:    [name] [name] is a 27 y/o woman with history of right sided ectopic pregnancy with left tubal disease, history of endometriosis presenting today with LLQ abdominal pain ongoing x 2 weeks. She reports that the pain started two weeks ago in her LLQ and radiates up towards her belly button. She reports the pain is worsening. She reports the pain is worsened by standing, going up stairs, moving her arms above her head. She has felt feverish, some chills. She was seen by her gynecologist yesterday who did a pelvic exam as well as transvaginal and pelvic ultrasound. She reports that there were no cysts or ectopic pregnancy. Last menstrual period 08/27/2017. She denies vaginal discharge. She reports possibly some more urinary frequency, but no dysuria or blood in her urine. Remaining pertinent info below.   Abdominal Pain  This is a new problem. The current episode started 1 to 4 weeks ago. The onset quality is gradual. The problem occurs constantly. The problem has been gradually worsening. The pain is located in the LLQ. The pain is at a severity of 6/10. The quality of the pain is dull and sharp. Associated symptoms include a fever (Last night was 99.8) and headaches. Pertinent negatives include no anorexia, arthralgias, belching, constipation, diarrhea, dysuria, flatus, frequency, hematuria, melena, myalgias, nausea, vomiting or weight loss. Treatments tried: Ibuprofen. The treatment provided no relief.       No Known Allergies   Current Outpatient Medications:  .  Azelastine-Fluticasone (DYMISTA) 137-50 MCG/ACT SUSP, Place into the nose., Disp: , Rfl:  .  buPROPion (WELLBUTRIN SR) 150 MG 12 hr tablet, Take one pill in  the morning for the first three days. On day 4, take on pill twice daily for remainder., Disp: 180 tablet, Rfl: 0 .  montelukast (SINGULAIR) 10 MG tablet, Take 10 mg by mouth at bedtime., Disp: , Rfl:  .  Multiple Vitamins-Minerals (HAIR SKIN AND NAILS FORMULA PO), Take by mouth daily., Disp: , Rfl:  .  amoxicillin (AMOXIL) 875 MG tablet, Take 1 tablet (875 mg total) by mouth 2 (two) times daily., Disp: 20 tablet, Rfl: 0  Review of Systems  Constitutional: Positive for fever (Last night was 99.8). Negative for activity change, appetite change, diaphoresis, fatigue, unexpected weight change and weight loss.  Gastrointestinal: Positive for abdominal pain. Negative for abdominal distention, anal bleeding, anorexia, blood in stool, constipation, diarrhea, flatus, melena, nausea, rectal pain and vomiting.  Genitourinary: Negative for dysuria, frequency and hematuria.  Musculoskeletal: Negative for arthralgias and myalgias.  Neurological: Positive for headaches. Negative for dizziness and light-headedness.    Social History   Tobacco Use  . Smoking status: Current Every Day Smoker    Packs/day: 0.50    Years: 4.00    Pack years: 2.00    Types: Cigarettes  . Smokeless tobacco: Never Used  Substance Use Topics  . Alcohol use: Yes    Alcohol/week: 3.0 oz    Types: 1 Standard drinks or equivalent, 4 Cans of beer per week    Comment: weekends   Objective:   BP 134/86 (BP Location: Right Arm, Patient Position: Sitting, Cuff Size: Normal)   Pulse 80  Temp 97.9 F (36.6 C) (Oral)   Resp 16   Wt 125 lb (56.7 kg)   LMP 08/16/2017   BMI 20.80 kg/m  Vitals:   08/29/17 0927  BP: 134/86  Pulse: 80  Resp: 16  Temp: 97.9 F (36.6 C)  TempSrc: Oral  Weight: 125 lb (56.7 kg)     Physical Exam  Constitutional: She is oriented to person, place, and time. She appears well-developed and well-nourished. No distress.  Cardiovascular: Normal rate and regular rhythm.  Pulmonary/Chest: Effort  normal and breath sounds normal.  Abdominal: Soft. Bowel sounds are normal. She exhibits no distension and no mass. There is no hepatosplenomegaly. There is tenderness in the suprapubic area and left lower quadrant. There is rebound and guarding. There is no rigidity, no CVA tenderness, no tenderness at McBurney's point and negative Murphy's sign.  Neurological: She is alert and oriented to person, place, and time.  Skin: Skin is warm and dry.  Psychiatric: She has a normal mood and affect. Her behavior is normal.        Assessment & Plan:     1. LLQ abdominal pain  She has had her right fallopian tube removed and does have a history of endometriosis of her left fallopain tube noted on right salpingectomy, otherwise all organs intact. Urine pregnancy in office negative. Patient reports that her ultrasounds at gynecology did not reveal ectopic pregnancy or cyst, though I don't have those records available to me today. This could possibly be flare of her endometriosis that was noted on prior salpingectomy. With rebound tenderness, Concerned for possible diverticulitis, though she is young. Possible anatomical variant of appendix, though rare. Concern for progressive pain and also rebound tenderness in regards to the above etiologies.  Labs and imaging as below.  - CBC with Differential - POCT Urinalysis Dipstick - Sed Rate (ESR) - Comprehensive Metabolic Panel (CMET) - POCT urine pregnancy  2. Fever, unspecified fever cause  - CBC with Differential - Sed Rate (ESR) - Comprehensive Metabolic Panel (CMET)  3. Left lower quadrant pain  - POCT urine pregnancy - CT Abdomen Pelvis W Contrast; Future  Return if symptoms worsen or fail to improve..  The entirety of the information documented in the History of Present Illness, Review of Systems and Physical Exam were personally obtained by me. Portions of this information were initially documented by Ashley Royalty, CMA and reviewed by me for  thoroughness and accuracy.         Trinna Post, PA-C  [facility]

## 2017-08-29 NOTE — Patient Instructions (Signed)

## 2017-08-30 NOTE — Progress Notes (Signed)
Advised  ED 

## 2017-08-31 LAB — CULTURE, URINE COMPREHENSIVE

## 2017-09-01 ENCOUNTER — Telehealth: Payer: Self-pay

## 2017-09-01 NOTE — Telephone Encounter (Signed)
-----   Message from Trey SailorsAdriana M Pollak, [place]PA-C sent at 08/31/2017  4:49 PM EST ----- No organism on urine culture.

## 2017-09-01 NOTE — Telephone Encounter (Signed)
[facility]   ----- Message from Trey SailorsAdriana M Pollak, PA-C sent at 08/31/2017  4:49 PM EST ----- No organism on urine culture.

## 2017-09-01 NOTE — Telephone Encounter (Signed)
Pt advised-Andrika Peraza V Reyna Lorenzi, RMA  

## 2017-10-18 LAB — HM PAP SMEAR: HM Pap smear: NORMAL

## 2018-05-30 ENCOUNTER — Ambulatory Visit (INDEPENDENT_AMBULATORY_CARE_PROVIDER_SITE_OTHER): Payer: BC Managed Care – PPO | Admitting: Physician Assistant

## 2018-05-30 ENCOUNTER — Encounter: Payer: Self-pay | Admitting: Physician Assistant

## 2018-05-30 VITALS — BP 120/60 | HR 83 | Temp 98.4°F | Resp 16 | Wt 130.0 lb

## 2018-05-30 DIAGNOSIS — B359 Dermatophytosis, unspecified: Secondary | ICD-10-CM

## 2018-05-30 MED ORDER — MICONAZOLE NITRATE 2 % EX CREA: 1.0000 "application " | TOPICAL_CREAM | Freq: Two times a day (BID) | CUTANEOUS | 0 refills | Status: DC

## 2018-05-30 NOTE — Patient Instructions (Signed)

## 2018-05-30 NOTE — Progress Notes (Signed)
       [name] Female    DOB: 11-29-1990   27 y.o.   MRN: [id number] Visit Date: 06/06/2018  Today's Provider: Trey Sailors, PA-C   Chief Complaint  Patient presents with  . Rash   Subjective:    HPI Patient here today c/o rash on right leg x's one month. Patient denies any new exposures. Patient reports she was out working on the yard and noticed it then. Patient reports itching and denies pain. Patient reports rash is not spreading. Patient reports using OTC antifungal cream reports mild symptom control.     No Known Allergies   Current Outpatient Medications:  .  Azelastine-Fluticasone (DYMISTA) 137-50 MCG/ACT SUSP, Place into the nose., Disp: , Rfl:  .  buPROPion (WELLBUTRIN SR) 150 MG 12 hr tablet, Take one pill in the morning for the first three days. On day 4, take on pill twice daily for remainder., Disp: 180 tablet, Rfl: 0 .  montelukast (SINGULAIR) 10 MG tablet, Take 10 mg by mouth at bedtime., Disp: , Rfl:  .  Multiple Vitamins-Minerals (HAIR SKIN AND NAILS FORMULA PO), Take by mouth daily., Disp: , Rfl:  .  miconazole (MICOTIN) 2 % cream, Apply 1 application topically 2 (two) times daily., Disp: 28.35 g, Rfl: 0  Review of Systems  Social History   Tobacco Use  . Smoking status: Current Every Day Smoker    Packs/day: 0.50    Years: 4.00    Pack years: 2.00    Types: Cigarettes  . Smokeless tobacco: Never Used  Substance Use Topics  . Alcohol use: Yes    Alcohol/week: 5.0 standard drinks    Types: 1 Standard drinks or equivalent, 4 Cans of beer per week    Comment: weekends   Objective:   BP 120/60 (BP Location: Left Arm, Patient Position: Sitting, Cuff Size: Normal)   Pulse 83   Temp 98.4 F (36.9 C) (Oral)   Resp 16   Wt 130 lb (59 kg)   SpO2 97%   BMI 21.63 kg/m  Vitals:   05/30/18 1602  BP: 120/60  Pulse: 83  Resp: 16  Temp: 98.4 F (36.9 C)  TempSrc: Oral  SpO2: 97%  Weight: 130 lb (59 kg)     Physical Exam    Constitutional: She is oriented to person, place, and time. She appears well-developed and well-nourished.  Cardiovascular: Normal rate.  Pulmonary/Chest: Effort normal.  Neurological: She is alert and oriented to person, place, and time.  Skin: Skin is warm and dry. Rash noted.           Assessment & Plan:     1. Ringworm  Counseled on using cream until 2 weeks after resolution of rash.   - miconazole (MICOTIN) 2 % cream; Apply 1 application topically 2 (two) times daily.  Dispense: 28.35 g; Refill: 0  Return if symptoms worsen or fail to improve.  The entirety of the information documented in the History of Present Illness, Review of Systems and Physical Exam were personally obtained by me. Portions of this information were initially documented by Rondel Baton, CMA and reviewed by me for thoroughness and accuracy.        Trey Sailors, PA-C  [facility]

## 2018-06-06 ENCOUNTER — Ambulatory Visit (INDEPENDENT_AMBULATORY_CARE_PROVIDER_SITE_OTHER): Payer: BC Managed Care – PPO | Admitting: Physician Assistant

## 2018-06-06 ENCOUNTER — Encounter: Payer: Self-pay | Admitting: Physician Assistant

## 2018-06-06 VITALS — BP 128/90 | HR 92 | Temp 98.4°F | Resp 20 | Wt 125.0 lb

## 2018-06-06 DIAGNOSIS — Z720 Tobacco use: Secondary | ICD-10-CM | POA: Diagnosis not present

## 2018-06-06 DIAGNOSIS — J329 Chronic sinusitis, unspecified: Secondary | ICD-10-CM | POA: Diagnosis not present

## 2018-06-06 MED ORDER — AMOXICILLIN-POT CLAVULANATE 875-125 MG PO TABS: 1.0000 | ORAL_TABLET | Freq: Two times a day (BID) | ORAL | 0 refills | Status: AC

## 2018-06-06 NOTE — Patient Instructions (Signed)

## 2018-06-06 NOTE — Progress Notes (Signed)
[name] Female    DOB: Jul 22, 1991   27 y.o.   MRN: [id number] Visit Date: 06/08/2018  Today's Provider: Trey Sailors, PA-C   Chief Complaint  Patient presents with  . URI   Subjective:    HPI Upper Respiratory Infection: Patient complains of symptoms of a URI, possible sinusitis. Symptoms include cough and sore throat. Onset of symptoms was 4 days ago, gradually worsening since that time. She also c/o congestion and productive cough with  green colored sputum for the past 1 days .  She is drinking plenty of fluids. Evaluation to date: none. Treatment to date: cough suppressants and decongestants.  mirena iud    No Known Allergies   Current Outpatient Medications:  .  Azelastine-Fluticasone (DYMISTA) 137-50 MCG/ACT SUSP, Place into the nose., Disp: , Rfl:  .  buPROPion (WELLBUTRIN SR) 150 MG 12 hr tablet, Take one pill in the morning for the first three days. On day 4, take on pill twice daily for remainder., Disp: 180 tablet, Rfl: 0 .  miconazole (MICOTIN) 2 % cream, Apply 1 application topically 2 (two) times daily., Disp: 28.35 g, Rfl: 0 .  montelukast (SINGULAIR) 10 MG tablet, Take 10 mg by mouth at bedtime., Disp: , Rfl:  .  Multiple Vitamins-Minerals (HAIR SKIN AND NAILS FORMULA PO), Take by mouth daily., Disp: , Rfl:  .  amoxicillin-clavulanate (AUGMENTIN) 875-125 MG tablet, Take 1 tablet by mouth 2 (two) times daily for 10 days., Disp: 20 tablet, Rfl: 0  Review of Systems  HENT: Positive for congestion, rhinorrhea, sinus pressure and sore throat.   Respiratory: Positive for cough, chest tightness, shortness of breath and wheezing.     Social History   Tobacco Use  . Smoking status: Current Every Day Smoker    Packs/day: 0.50    Years: 4.00    Pack years: 2.00    Types: Cigarettes  . Smokeless tobacco: Never Used  Substance Use Topics  . Alcohol use: Yes    Alcohol/week: 5.0 standard drinks    Types: 1 Standard drinks or equivalent, 4  Cans of beer per week    Comment: weekends   Objective:   BP 128/90 (BP Location: Left Arm, Patient Position: Sitting, Cuff Size: Normal)   Pulse 92   Temp 98.4 F (36.9 C) (Oral)   Resp 20   Wt 125 lb (56.7 kg)   SpO2 99%   BMI 20.80 kg/m  Vitals:   06/06/18 1201  BP: 128/90  Pulse: 92  Resp: 20  Temp: 98.4 F (36.9 C)  TempSrc: Oral  SpO2: 99%  Weight: 125 lb (56.7 kg)     Physical Exam  Constitutional: She is oriented to person, place, and time. She appears well-developed and well-nourished. No distress.  HENT:  Right Ear: External ear normal.  Left Ear: External ear normal.  Nose: Right sinus exhibits maxillary sinus tenderness and frontal sinus tenderness. Left sinus exhibits maxillary sinus tenderness and frontal sinus tenderness.  Mouth/Throat: Oropharynx is clear and moist. No oropharyngeal exudate, posterior oropharyngeal edema or posterior oropharyngeal erythema.  Tms opaque bilaterally   Eyes: Conjunctivae are normal. Right eye exhibits no discharge. Left eye exhibits no discharge.  Neck: Neck supple.  Cardiovascular: Normal rate and regular rhythm.  Pulmonary/Chest: Effort normal and breath sounds normal.  Lymphadenopathy:    She has cervical adenopathy.  Neurological: She is alert and oriented to person, place, and time.  Skin: Skin is warm and dry. She is not  diaphoretic.  Psychiatric: She has a normal mood and affect. Her behavior is normal.        Assessment & Plan:     1. Sinusitis, unspecified chronicity, unspecified location  - amoxicillin-clavulanate (AUGMENTIN) 875-125 MG tablet; Take 1 tablet by mouth 2 (two) times daily for 10 days.  Dispense: 20 tablet; Refill: 0  2. Tobacco abuse  She has been prescribed wellbutrin by her work cessation program.  Return if symptoms worsen or fail to improve.  The entirety of the information documented in the History of Present Illness, Review of Systems and Physical Exam were personally obtained by  me. Portions of this information were initially documented by Rondel Baton, CMA and reviewed by me for thoroughness and accuracy.          Trey Sailors, PA-C  [facility]

## 2018-10-05 ENCOUNTER — Ambulatory Visit (INDEPENDENT_AMBULATORY_CARE_PROVIDER_SITE_OTHER): Payer: BC Managed Care – PPO | Admitting: Physician Assistant

## 2018-10-05 ENCOUNTER — Encounter: Payer: Self-pay | Admitting: Physician Assistant

## 2018-10-05 VITALS — BP 138/87 | HR 93 | Temp 98.5°F | Resp 16 | Wt 129.0 lb

## 2018-10-05 DIAGNOSIS — J0110 Acute frontal sinusitis, unspecified: Secondary | ICD-10-CM | POA: Diagnosis not present

## 2018-10-05 MED ORDER — FLUCONAZOLE 150 MG PO TABS: ORAL_TABLET | ORAL | 0 refills | Status: DC

## 2018-10-05 MED ORDER — AMOXICILLIN-POT CLAVULANATE 875-125 MG PO TABS: 1.0000 | ORAL_TABLET | Freq: Two times a day (BID) | ORAL | 0 refills | Status: AC

## 2018-10-05 NOTE — Patient Instructions (Signed)

## 2018-10-05 NOTE — Progress Notes (Signed)
[name] Female    DOB: November 04, 1990   28 y.o.   MRN: [id number] Visit Date: 10/05/2018  Today's Provider: Trey Sailors, PA-C   Chief Complaint  Patient presents with  . URI   Subjective:     HPI  Upper Respiratory Infection: Patient with history of tympanostomy tubes, recurrent sinus infection complains of symptoms of a URI, possible sinusitis. Symptoms include congestion, cough and sore throat. Onset of symptoms was 1 week ago, gradually worsening since that time. She also c/o congestion and cough described as productive of yellow sputum for the past 1 week .  She is drinking plenty of fluids. Evaluation to date: none. Treatment to date: cough suppressants and decongestants.  Patient reports she is seeing Dr. Andee Poles in 11/2018 for checkup on her ear tubes.    No Known Allergies   Current Outpatient Medications:  .  amoxicillin-clavulanate (AUGMENTIN) 875-125 MG tablet, Take 1 tablet by mouth 2 (two) times daily for 10 days., Disp: 20 tablet, Rfl: 0 .  Azelastine-Fluticasone (DYMISTA) 137-50 MCG/ACT SUSP, Place into the nose., Disp: , Rfl:  .  buPROPion (WELLBUTRIN SR) 150 MG 12 hr tablet, Take one pill in the morning for the first three days. On day 4, take on pill twice daily for remainder., Disp: 180 tablet, Rfl: 0 .  fluconazole (DIFLUCAN) 150 MG tablet, Take 1 pill on day 1. Take a second pill three days later if still symptomatic., Disp: 1 tablet, Rfl: 0 .  miconazole (MICOTIN) 2 % cream, Apply 1 application topically 2 (two) times daily., Disp: 28.35 g, Rfl: 0 .  montelukast (SINGULAIR) 10 MG tablet, Take 10 mg by mouth at bedtime., Disp: , Rfl:  .  Multiple Vitamins-Minerals (HAIR SKIN AND NAILS FORMULA PO), Take by mouth daily., Disp: , Rfl:   Review of Systems  HENT: Positive for congestion and sore throat.   Respiratory: Positive for cough.     Social History   Tobacco Use  . Smoking status: Former Smoker    Packs/day: 0.50    Years: 4.00    Pack years: 2.00    Types: Cigarettes  . Smokeless tobacco: Never Used  Substance Use Topics  . Alcohol use: Yes    Alcohol/week: 5.0 standard drinks    Types: 4 Cans of beer, 1 Standard drinks or equivalent per week    Comment: weekends      Objective:   BP 138/87 (BP Location: Right Arm, Patient Position: Sitting, Cuff Size: Normal)   Pulse 93   Temp 98.5 F (36.9 C) (Oral)   Resp 16   Wt 129 lb (58.5 kg)   SpO2 99%   BMI 21.47 kg/m  Vitals:   10/05/18 1400  BP: 138/87  Pulse: 93  Resp: 16  Temp: 98.5 F (36.9 C)  TempSrc: Oral  SpO2: 99%  Weight: 129 lb (58.5 kg)     Physical Exam Constitutional:      General: She is not in acute distress.    Appearance: She is well-developed. She is not diaphoretic.  HENT:     Right Ear: External ear normal.     Left Ear: External ear normal.     Nose:     Right Sinus: Maxillary sinus tenderness and frontal sinus tenderness present.     Left Sinus: Maxillary sinus tenderness and frontal sinus tenderness present.     Mouth/Throat:     Pharynx: No oropharyngeal exudate or posterior oropharyngeal erythema.  Eyes:  General:        Right eye: No discharge.        Left eye: No discharge.     Conjunctiva/sclera: Conjunctivae normal.  Neck:     Musculoskeletal: Neck supple.  Cardiovascular:     Rate and Rhythm: Normal rate and regular rhythm.  Pulmonary:     Effort: Pulmonary effort is normal.     Breath sounds: Normal breath sounds.  Lymphadenopathy:     Cervical: Cervical adenopathy present.  Skin:    General: Skin is warm and dry.  Neurological:     Mental Status: She is alert and oriented to person, place, and time.  Psychiatric:        Behavior: Behavior normal.         Assessment & Plan    1. Acute non-recurrent frontal sinusitis  Recommend she bring this up with Dr. Andee Poles as she has frequent infections, she may need further evaluation by specialist. Diflucan given because patient reports she get yeast  infections. Requesting PAP smear from [facility] in [place], [place].   - amoxicillin-clavulanate (AUGMENTIN) 875-125 MG tablet; Take 1 tablet by mouth 2 (two) times daily for 10 days.  Dispense: 20 tablet; Refill: 0 - fluconazole (DIFLUCAN) 150 MG tablet; Take 1 pill on day 1. Take a second pill three days later if still symptomatic.  Dispense: 1 tablet; Refill: 0  The entirety of the information documented in the History of Present Illness, Review of Systems and Physical Exam were personally obtained by me. Portions of this information were initially documented by Rondel Baton, CMA and reviewed by me for thoroughness and accuracy.   Return if symptoms worsen or fail to improve.     Trey Sailors, PA-C  [facility]

## 2018-10-09 ENCOUNTER — Encounter: Payer: Self-pay | Admitting: Physician Assistant

## 2020-03-02 ENCOUNTER — Telehealth (INDEPENDENT_AMBULATORY_CARE_PROVIDER_SITE_OTHER): Payer: BC Managed Care – PPO | Admitting: Physician Assistant

## 2020-03-02 DIAGNOSIS — H6981 Other specified disorders of Eustachian tube, right ear: Secondary | ICD-10-CM | POA: Diagnosis not present

## 2020-03-02 DIAGNOSIS — H6991 Unspecified Eustachian tube disorder, right ear: Secondary | ICD-10-CM

## 2020-03-02 DIAGNOSIS — H60501 Unspecified acute noninfective otitis externa, right ear: Secondary | ICD-10-CM

## 2020-03-02 MED ORDER — CIPROFLOXACIN-DEXAMETHASONE 0.3-0.1 % OT SUSP: 4.0000 [drp] | Freq: Two times a day (BID) | OTIC | 0 refills | Status: DC

## 2020-03-02 NOTE — Progress Notes (Signed)
MyChart Video Visit    Virtual Visit via Video Note   This visit type was conducted due to national recommendations for restrictions regarding the COVID-19 Pandemic (e.g. social distancing) in an effort to limit this patient's exposure and mitigate transmission in our community. This patient is at least at moderate risk for complications without adequate follow up. This format is felt to be most appropriate for this patient at this time. Physical exam was limited by quality of the video and audio technology used for the visit.   Patient location: Home Provider location: Office    I discussed the limitations of evaluation and management by telemedicine and the availability of in person appointments. The patient expressed understanding and agreed to proceed.    [name]   DOB: [birth year]   29 y.o. Female  MRN: [id number] Visit Date: 03/02/2020  Today's healthcare provider: Trey Sailors, PA-C   Chief Complaint  Patient presents with  . Ear Fullness  I,Adriana M Pollak,acting as a scribe for Trey Sailors, PA-C.,have documented all relevant documentation on the behalf of Trey Sailors, PA-C,as directed by  Trey Sailors, PA-C while in the presence of Trey Sailors, PA-C.  Subjective    Ear Fullness  There is pain in the right ear. This is a new problem. The current episode started 1 to 4 weeks ago. The problem occurs constantly. The problem has been unchanged. There has been no fever. The pain is at a severity of 4/10. The pain is mild. Pertinent negatives include no coughing, diarrhea, ear discharge, hearing loss or rhinorrhea. She has tried ear drops for the symptoms. The treatment provided no relief. Her past medical history is significant for a chronic ear infection and a tympanostomy tube.    Reports she was swimming a week ago. Now her right ear feels swollen or stuck. No draining, no fever. One swollen lymph node. She does have bilateral  tympanostomy tubes. Continues to smoke.     Medications: Outpatient Medications Prior to Visit  Medication Sig  . Azelastine-Fluticasone (DYMISTA) 137-50 MCG/ACT SUSP Place into the nose.  . montelukast (SINGULAIR) 10 MG tablet Take 10 mg by mouth at bedtime.  . Multiple Vitamins-Minerals (HAIR SKIN AND NAILS FORMULA PO) Take by mouth daily.  Marland Kitchen buPROPion (WELLBUTRIN SR) 150 MG 12 hr tablet Take one pill in the morning for the first three days. On day 4, take on pill twice daily for remainder. (Patient not taking: Reported on 03/02/2020)  . fluconazole (DIFLUCAN) 150 MG tablet Take 1 pill on day 1. Take a second pill three days later if still symptomatic. (Patient not taking: Reported on 03/02/2020)  . miconazole (MICOTIN) 2 % cream Apply 1 application topically 2 (two) times daily. (Patient not taking: Reported on 03/02/2020)   No facility-administered medications prior to visit.    Review of Systems  HENT: Negative for ear discharge, ear pain, hearing loss and rhinorrhea.   Respiratory: Negative for cough.   Gastrointestinal: Negative for diarrhea.      Objective    There were no vitals taken for this visit.   Physical Exam Constitutional:      Appearance: Normal appearance. She is not ill-appearing.  Pulmonary:     Effort: Pulmonary effort is normal. No respiratory distress.  Neurological:     Mental Status: She is alert and oriented to person, place, and time. Mental status is at baseline.  Psychiatric:        Mood and Affect: Mood  normal.        Behavior: Behavior normal.        Assessment & Plan    1. Dysfunction of right eustachian tube  Will cover with drops as below. May need to come in office if not improving.   - ciprofloxacin-dexamethasone (CIPRODEX) OTIC suspension; Place 4 drops into the right ear 2 (two) times daily.  Dispense: 7.5 mL; Refill: 0  2. Acute otitis externa of right ear, unspecified type  - ciprofloxacin-dexamethasone (CIPRODEX) OTIC  suspension; Place 4 drops into the right ear 2 (two) times daily.  Dispense: 7.5 mL; Refill: 0     No follow-ups on file.     I discussed the assessment and treatment plan with the patient. The patient was provided an opportunity to ask questions and all were answered. The patient agreed with the plan and demonstrated an understanding of the instructions.   The patient was advised to call back or seek an in-person evaluation if the symptoms worsen or if the condition fails to improve as anticipated.  ITrey Sailors, PA-C, have reviewed all documentation for this visit. The documentation on 03/04/20 for the exam, diagnosis, procedures, and orders are all accurate and complete.   Maryella Shivers [facility] [phone] (phone) [phone] (fax)  [facility]

## 2021-03-03 ENCOUNTER — Other Ambulatory Visit: Payer: Self-pay | Admitting: Physician Assistant

## 2021-03-03 DIAGNOSIS — Z720 Tobacco use: Secondary | ICD-10-CM

## 2021-03-03 MED ORDER — BUPROPION HCL ER (SR) 150 MG PO TB12: 150.0000 mg | ORAL_TABLET | Freq: Two times a day (BID) | ORAL | 0 refills | Status: DC

## 2021-03-03 NOTE — Telephone Encounter (Signed)
Medication Refill - Medication: buPROPion (WELLBUTRIN SR) 150 MG 12 hr tablet  Has the patient contacted their pharmacy? Yes.   (Agent: If no, request that the patient contact the pharmacy for the refill.) (Agent: If yes, when and what did the pharmacy advise?)  Preferred Pharmacy (with phone number or street name):  [facility] [place], [place] - [address] Phone:  [phone]  Fax:  [phone]      Agent: Please be advised that RX refills may take up to 3 business days. We ask that you follow-up with your pharmacy.

## 2021-03-03 NOTE — Telephone Encounter (Signed)
   Notes to clinic:  Requested medication has expired Review for new script    Requested Prescriptions  Pending Prescriptions Disp Refills   buPROPion (WELLBUTRIN SR) 150 MG 12 hr tablet 180 tablet 0    Sig: Take one pill in the morning for the first three days. On day 4, take on pill twice daily for remainder.      Psychiatry: Antidepressants - bupropion Failed - 03/03/2021 10:46 AM      Failed - Valid encounter within last 6 months    Recent Outpatient Visits           1 year ago Dysfunction of right eustachian tube   [facility] [place], Ricki Rodriguez M, [place]   2 years ago Acute non-recurrent frontal sinusitis   [facility] Osvaldo Angst M, [place]   2 years ago Sinusitis, unspecified chronicity, unspecified location   [facility] [place], Lavella Hammock, [place]   2 years ago Ringworm   [facility] [place], [place], [place]   3 years ago LLQ abdominal pain   [facility] Trey Sailors, [place]       Future Appointments             In 2 months Bacigalupo, Marzella Schlein, MD [facility] [place], [facility]             Passed - Last BP in normal range    BP Readings from Last 1 Encounters:  10/05/18 138/87

## 2021-05-11 NOTE — Progress Notes (Deleted)
      Established patient visit   [name]   DOB: Jul 08, 1991   30 y.o. Female  MRN: [id number] Visit Date: 05/17/2021  Today's healthcare provider: Shirlee Latch, MD   No chief complaint on file.  Subjective    HPI  ***  {Link to patient history deactivated due to formatting error:1}  Medications: Outpatient Medications Prior to Visit  Medication Sig   Azelastine-Fluticasone (DYMISTA) 137-50 MCG/ACT SUSP Place into the nose.   buPROPion (WELLBUTRIN SR) 150 MG 12 hr tablet Take 1 tablet (150 mg total) by mouth 2 (two) times daily. Take one pill in the morning for the first three days. On day 4, take on pill twice daily for remainder.   ciprofloxacin-dexamethasone (CIPRODEX) OTIC suspension Place 4 drops into the right ear 2 (two) times daily.   fluconazole (DIFLUCAN) 150 MG tablet Take 1 pill on day 1. Take a second pill three days later if still symptomatic. (Patient not taking: Reported on 03/02/2020)   miconazole (MICOTIN) 2 % cream Apply 1 application topically 2 (two) times daily. (Patient not taking: Reported on 03/02/2020)   montelukast (SINGULAIR) 10 MG tablet Take 10 mg by mouth at bedtime.   Multiple Vitamins-Minerals (HAIR SKIN AND NAILS FORMULA PO) Take by mouth daily.   No facility-administered medications prior to visit.    Review of Systems  {Labs  Heme  Chem  Endocrine  Serology  Results Review (optional):23779}   Objective    There were no vitals taken for this visit. {Show previous vital signs (optional):23777}  Physical Exam  ***  No results found for any visits on 05/17/21.  Assessment & Plan     ***  No follow-ups on file.      {provider attestation***:1}   Shirlee Latch, MD  [facility] [phone] (phone) [phone] (fax)  [facility]

## 2021-05-17 ENCOUNTER — Ambulatory Visit: Payer: BC Managed Care – PPO | Admitting: Family Medicine

## 2021-06-18 LAB — OB RESULTS CONSOLE ABO/RH: RH Type: POSITIVE

## 2021-06-18 LAB — OB RESULTS CONSOLE HIV ANTIBODY (ROUTINE TESTING): HIV: NONREACTIVE

## 2021-06-18 LAB — HEPATITIS C ANTIBODY: HCV Ab: NEGATIVE

## 2021-06-18 LAB — OB RESULTS CONSOLE RUBELLA ANTIBODY, IGM: Rubella: IMMUNE

## 2021-06-18 LAB — OB RESULTS CONSOLE ANTIBODY SCREEN: Antibody Screen: NEGATIVE

## 2021-06-18 LAB — OB RESULTS CONSOLE HEPATITIS B SURFACE ANTIGEN: Hepatitis B Surface Ag: NEGATIVE

## 2021-06-18 LAB — OB RESULTS CONSOLE RPR: RPR: NONREACTIVE

## 2021-07-08 LAB — OB RESULTS CONSOLE GC/CHLAMYDIA
Chlamydia: NEGATIVE
Neisseria Gonorrhea: NEGATIVE

## 2021-10-13 ENCOUNTER — Other Ambulatory Visit: Payer: Self-pay | Admitting: Obstetrics and Gynecology

## 2021-10-13 DIAGNOSIS — Z3689 Encounter for other specified antenatal screening: Secondary | ICD-10-CM

## 2021-11-02 ENCOUNTER — Ambulatory Visit: Payer: BLUE CROSS/BLUE SHIELD

## 2021-11-02 ENCOUNTER — Other Ambulatory Visit: Payer: BLUE CROSS/BLUE SHIELD

## 2021-11-09 ENCOUNTER — Other Ambulatory Visit: Payer: BLUE CROSS/BLUE SHIELD

## 2021-11-09 ENCOUNTER — Ambulatory Visit: Payer: BLUE CROSS/BLUE SHIELD

## 2021-11-10 ENCOUNTER — Encounter: Payer: Self-pay | Admitting: *Deleted

## 2021-11-15 ENCOUNTER — Ambulatory Visit (HOSPITAL_BASED_OUTPATIENT_CLINIC_OR_DEPARTMENT_OTHER): Payer: BC Managed Care – PPO

## 2021-11-15 ENCOUNTER — Ambulatory Visit: Payer: BC Managed Care – PPO | Attending: Obstetrics and Gynecology | Admitting: *Deleted

## 2021-11-15 ENCOUNTER — Ambulatory Visit (HOSPITAL_BASED_OUTPATIENT_CLINIC_OR_DEPARTMENT_OTHER): Payer: BC Managed Care – PPO | Admitting: Obstetrics and Gynecology

## 2021-11-15 VITALS — BP 131/74 | HR 75

## 2021-11-15 DIAGNOSIS — O43193 Other malformation of placenta, third trimester: Secondary | ICD-10-CM | POA: Insufficient documentation

## 2021-11-15 DIAGNOSIS — O09813 Supervision of pregnancy resulting from assisted reproductive technology, third trimester: Secondary | ICD-10-CM | POA: Diagnosis not present

## 2021-11-15 DIAGNOSIS — O43199 Other malformation of placenta, unspecified trimester: Secondary | ICD-10-CM

## 2021-11-15 DIAGNOSIS — Z3689 Encounter for other specified antenatal screening: Secondary | ICD-10-CM

## 2021-11-15 DIAGNOSIS — O358XX0 Maternal care for other (suspected) fetal abnormality and damage, not applicable or unspecified: Secondary | ICD-10-CM

## 2021-11-15 DIAGNOSIS — Z3A29 29 weeks gestation of pregnancy: Secondary | ICD-10-CM

## 2021-11-15 DIAGNOSIS — O283 Abnormal ultrasonic finding on antenatal screening of mother: Secondary | ICD-10-CM

## 2021-11-15 DIAGNOSIS — O09899 Supervision of other high risk pregnancies, unspecified trimester: Secondary | ICD-10-CM

## 2021-11-15 DIAGNOSIS — O09293 Supervision of pregnancy with other poor reproductive or obstetric history, third trimester: Secondary | ICD-10-CM | POA: Diagnosis present

## 2021-11-15 NOTE — Progress Notes (Signed)
Maternal-Fetal Medicine  ? ?Name: [name] [name] ?DOB: Jan 17, 1991 ?MRN: [id number] ?Referring Provider: Damaris Hippo, MD ? ?I had the pleasure of seeing Ms. [name] [name] at [facility]. She is G2 P0010 at 29-weeks' gestation and is here for a second opinion.  She was accompanied by her husband.  At your office ultrasound, single umbilical artery was seen. ?Patient conceived by IVF (ICSI).  Preimplantation screening and cell free fetal DNA screening report low risk for fetal aneuploidies. ?GYN history: Ectopic pregnancy in 2017 and she had right salpingectomy. ?Laparoscopic endometriosis surgery. ?Past medical history: No history of diabetes or hypertension or any chronic medical conditions. ?Past surgical history: Laparoscopy ?Allergies: No known drug allergies. ?Medications: Prenatal vitamins. ?Social history: Denies tobacco or drug or alcohol use.  She been married 2 years and her husband is in good health. ?Family history: No history of venous thromboembolism in the family. ? ?Ultrasound ?We performed a fetal anatomical survey.  Amniotic fluid is normal and good fetal activity seen.  Fetal growth is appropriate for gestational age.  An echogenic intracardiac focus is seen.  We confirmed the presence of a single umbilical artery.  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 Cardiac anatomy appears normal. ?No other obvious fetal structural defects are seen. ? ?Single umbilical artery (SUA) ?SUA is seen in about 0.25% to 1% of fetuses. In the absence of other anomalies, the risk for aneuploidies is not increased. However, ultrasound has limitations in detecting fetal anomalies that may be missed on target at anatomical survey.  SUA can be associated with fetal growth restriction and we recommend serial growth scans for fetal growth assessment. The association of SUA with cardiac anomalies is not conclusively proven.  She conceived by IVF. ?I discussed fetal echocardiography by pediatric cardiologist.  Patient opted not to have fetal echocardiography. ? ?Marginal cord insertion ?I explained the diagnosis of marginal cord insertion with help of diagrams.  It is, usually, not associated with fetal adverse outcomes.  Fetal growth restriction may be seen in some cases. ? ?Echogenic intracardiac focus ?I explained the finding of echogenic intracardiac focus that it is present in about 3% to 4% of normal fetuses.  It is a marker for Down syndrome.  Given that she had low risk for fetal Down syndrome on prenatal screening, the risk for Down syndrome is not increased.  I do not recommend amniocentesis for this finding. ? ?Recommendations ?-Fetal growth assessments every 4 weeks. ?-Weekly BPP from [redacted] weeks gestation till delivery. ? ?Thank you for consultation.  If you have any questions or concerns, please contact me the [facility].  Consultation including face-to-face (more than 50%) counseling 30 minutes. ? ?

## 2021-12-23 ENCOUNTER — Other Ambulatory Visit: Payer: Self-pay

## 2021-12-23 ENCOUNTER — Encounter (HOSPITAL_COMMUNITY): Payer: Self-pay | Admitting: Obstetrics and Gynecology

## 2021-12-23 ENCOUNTER — Inpatient Hospital Stay (HOSPITAL_COMMUNITY)
Admission: AD | Admit: 2021-12-23 | Discharge: 2021-12-23 | Disposition: A | Discharge status: Home / Self Care | Payer: BC Managed Care – PPO | Attending: Obstetrics and Gynecology | Admitting: Obstetrics and Gynecology

## 2021-12-23 ENCOUNTER — Inpatient Hospital Stay (HOSPITAL_BASED_OUTPATIENT_CLINIC_OR_DEPARTMENT_OTHER): Payer: BC Managed Care – PPO

## 2021-12-23 DIAGNOSIS — O43193 Other malformation of placenta, third trimester: Secondary | ICD-10-CM

## 2021-12-23 DIAGNOSIS — Z3689 Encounter for other specified antenatal screening: Secondary | ICD-10-CM | POA: Diagnosis not present

## 2021-12-23 DIAGNOSIS — O26893 Other specified pregnancy related conditions, third trimester: Secondary | ICD-10-CM | POA: Insufficient documentation

## 2021-12-23 DIAGNOSIS — O358XX0 Maternal care for other (suspected) fetal abnormality and damage, not applicable or unspecified: Secondary | ICD-10-CM | POA: Diagnosis not present

## 2021-12-23 DIAGNOSIS — Z3A34 34 weeks gestation of pregnancy: Secondary | ICD-10-CM

## 2021-12-23 DIAGNOSIS — O1493 Unspecified pre-eclampsia, third trimester: Secondary | ICD-10-CM | POA: Diagnosis not present

## 2021-12-23 DIAGNOSIS — O163 Unspecified maternal hypertension, third trimester: Secondary | ICD-10-CM | POA: Insufficient documentation

## 2021-12-23 LAB — COMPREHENSIVE METABOLIC PANEL
ALT: 18 U/L (ref 0–44)
AST: 19 U/L (ref 15–41)
Albumin: 2.7 g/dL — ABNORMAL LOW (ref 3.5–5.0)
Alkaline Phosphatase: 115 U/L (ref 38–126)
Anion gap: 6 (ref 5–15)
BUN: 5 mg/dL — ABNORMAL LOW (ref 6–20)
CO2: 23 mmol/L (ref 22–32)
Calcium: 8.7 mg/dL — ABNORMAL LOW (ref 8.9–10.3)
Chloride: 110 mmol/L (ref 98–111)
Creatinine, Ser: 0.66 mg/dL (ref 0.44–1.00)
GFR, Estimated: >60 mL/min (ref >60)
Glucose, Bld: 73 mg/dL (ref 70–99)
Potassium: 3.6 mmol/L (ref 3.5–5.1)
Sodium: 139 mmol/L (ref 135–145)
Total Bilirubin: 0.4 mg/dL (ref 0.3–1.2)
Total Protein: 5.9 g/dL — ABNORMAL LOW (ref 6.5–8.1)

## 2021-12-23 LAB — CBC
HCT: 37.6 % (ref 36.0–46.0)
Hemoglobin: 13.2 g/dL (ref 12.0–15.0)
MCH: 30.4 pg (ref 26.0–34.0)
MCHC: 35.1 g/dL (ref 30.0–36.0)
MCV: 86.6 fL (ref 80.0–100.0)
Platelets: 197 10*3/uL (ref 150–400)
RBC: 4.34 MIL/uL (ref 3.87–5.11)
RDW: 12.2 % (ref 11.5–15.5)
WBC: 10.2 10*3/uL (ref 4.0–10.5)
nRBC: 0 % (ref 0.0–0.2)

## 2021-12-23 LAB — URINALYSIS, ROUTINE W REFLEX MICROSCOPIC
Bilirubin Urine: NEGATIVE
Glucose, UA: NEGATIVE mg/dL
Hgb urine dipstick: NEGATIVE
Ketones, ur: NEGATIVE mg/dL
Leukocytes,Ua: NEGATIVE
Nitrite: NEGATIVE
Protein, ur: 30 mg/dL — AB
Specific Gravity, Urine: 1.005 (ref 1.005–1.030)
pH: 7 (ref 5.0–8.0)

## 2021-12-23 LAB — PROTEIN / CREATININE RATIO, URINE
Creatinine, Urine: 33.66 mg/dL
Protein Creatinine Ratio: 0.92 mg/mg{Cre} — ABNORMAL HIGH (ref 0.00–0.15)
Total Protein, Urine: 31 mg/dL

## 2021-12-23 NOTE — MAU Provider Note (Signed)
History     CSN: [id number]  Arrival date and time: 12/23/21 1640   Event Date/Time   First Provider Initiated Contact with Patient 12/23/21 1727      Chief Complaint  Patient presents with   Hypertension   Abdominal Pain   31 y.o. G2P0010 @34 .5 wks presenting from office with elevated BP. Her BP was 144/100 and 158/100 today. She denies HA, visual disturbances, SOB, and CP. Endorses pain under right rib cage since arrival. Rates pain 3/10. She has no hx of HTN. She denies pregnancy complaints. Reports good FM. Her pregnancy is complicated by IVF, SUA, MCI, and EIF.     OB History     Gravida  2   Para      Term      Preterm      AB  1   Living  0      SAB      IAB      Ectopic  1   Multiple      Live Births              Past Medical History:  Diagnosis Date   Complication of anesthesia    slow to wake   Ectopic pregnancy    Family history of adverse reaction to anesthesia    Mother - slow to wake   Infection    UTI   Ovarian cyst     Past Surgical History:  Procedure Laterality Date   HERNIA REPAIR     KNEE SURGERY Right    LAPAROSCOPY     LAPAROSCOPY N/A 02/19/2016   Procedure: LAPAROSCOPY DIAGNOSTIC WITH RIGHT SALPINGECTOMY;  Surgeon: Mitchel HonourMegan Morris, DO;  Location: [facility];  Service: Gynecology;  Laterality: N/A;   MYRINGOTOMY WITH TUBE PLACEMENT Bilateral 10/19/2016   Procedure: MYRINGOTOMY WITH TUBE PLACEMENT  butterfly tubes;  Surgeon: Bud Facereighton Vaught, MD;  [facility];  Service: ENT;  Laterality: Bilateral;  butterfly tubes   WISDOM TOOTH EXTRACTION      Family History  Problem Relation Age of Onset   Diabetes Maternal Grandfather    Heart disease Paternal Grandmother    Hyperlipidemia Paternal Grandmother    Stroke Paternal Grandmother    Heart disease Paternal Grandfather    Hyperlipidemia Paternal Grandfather    COPD Paternal Grandfather    Healthy Mother    Hypertension Mother    Healthy Father     Hypertension Father     Social History   Tobacco Use   Smoking status: Former    Packs/day: 0.50    Years: 4.00    Pack years: 2.00    Types: Cigarettes   Smokeless tobacco: Never  Vaping Use   Vaping Use: Never used  Substance Use Topics   Alcohol use: Yes    Alcohol/week: 5.0 standard drinks    Types: 4 Cans of beer, 1 Standard drinks or equivalent per week    Comment: weekends   Drug use: No    Allergies: No Known Allergies  Medications Prior to Admission  Medication Sig Dispense Refill Last Dose   Prenatal Vit-Fe Fumarate-FA (PREPLUS) 27-1 MG TABS Take by mouth.   12/23/2021   Azelastine-Fluticasone 137-50 MCG/ACT SUSP Place into the nose. (Patient not taking: Reported on 11/15/2021)   Unknown   buPROPion (WELLBUTRIN SR) 150 MG 12 hr tablet Take 1 tablet (150 mg total) by mouth 2 (two) times daily. Take one pill in the morning for the first three days. On day 4, take on pill  twice daily for remainder. (Patient not taking: Reported on 11/15/2021) 60 tablet 0 Unknown   ciprofloxacin-dexamethasone (CIPRODEX) OTIC suspension Place 4 drops into the right ear 2 (two) times daily. (Patient not taking: Reported on 11/15/2021) 7.5 mL 0 Unknown   fluconazole (DIFLUCAN) 150 MG tablet Take 1 pill on day 1. Take a second pill three days later if still symptomatic. (Patient not taking: Reported on 03/02/2020) 1 tablet 0 Unknown   miconazole (MICOTIN) 2 % cream Apply 1 application topically 2 (two) times daily. (Patient not taking: Reported on 03/02/2020) 28.35 g 0 Unknown   montelukast (SINGULAIR) 10 MG tablet Take 10 mg by mouth at bedtime. (Patient not taking: Reported on 11/15/2021)   Unknown   Multiple Vitamins-Minerals (HAIR SKIN AND NAILS FORMULA PO) Take by mouth daily.   Unknown    Review of Systems  Eyes:  Negative for visual disturbance.  Respiratory:  Negative for shortness of breath.   Cardiovascular:  Positive for leg swelling. Negative for chest pain.  Gastrointestinal:  Positive  for abdominal pain.  Genitourinary:  Negative for vaginal bleeding and vaginal discharge.  Neurological:  Negative for headaches.  Physical Exam   Blood pressure (!) 139/92, pulse 63, temperature 97.9 F (36.6 C), temperature source Oral, resp. rate 17, height 5\' 4"  (1.626 m), weight 74.8 kg, last menstrual period 05/06/2020, SpO2 99 %, unknown if currently breastfeeding. Patient Vitals for the past 24 hrs:  BP Temp Temp src Pulse Resp SpO2 Height Weight  12/23/21 1952 (!) 139/92 97.9 F (36.6 C) Oral 63 17 99 % -- --  12/23/21 1801 (!) 143/91 -- -- 74 -- -- -- --  12/23/21 1746 (!) 136/91 -- -- 73 -- -- -- --  12/23/21 1731 (!) 145/89 -- -- 66 -- -- -- --  12/23/21 1714 (!) 143/89 98.8 F (37.1 C) Oral 70 18 99 % -- --  12/23/21 1653 (!) 151/106 98.6 F (37 C) Oral 91 16 99 % 5\' 4"  (1.626 m) 74.8 kg    Physical Exam Vitals and nursing note reviewed.  Constitutional:      Appearance: Normal appearance.  HENT:     Head: Normocephalic and atraumatic.  Cardiovascular:     Rate and Rhythm: Normal rate.  Pulmonary:     Effort: Pulmonary effort is normal. No respiratory distress.  Abdominal:     Tenderness: There is no abdominal tenderness.     Comments: gravid  Musculoskeletal:        General: Swelling (LE/feet 1+ pitting) present. Normal range of motion.     Cervical back: Normal range of motion.  Skin:    General: Skin is warm and dry.  Neurological:     General: No focal deficit present.     Mental Status: She is alert and oriented to person, place, and time.  Psychiatric:        Mood and Affect: Mood normal.        Behavior: Behavior normal.  EFM: 140 bpm, mod variability, + accels, no decels Toco: rare  Results for orders placed or performed during the hospital encounter of 12/23/21 (from the past 24 hour(s))  CBC     Status: None   Collection Time: 12/23/21  4:49 PM  Result Value Ref Range   WBC 10.2 4.0 - 10.5 K/uL   RBC 4.34 3.87 - 5.11 MIL/uL   Hemoglobin 13.2  12.0 - 15.0 g/dL   HCT 12/25/21 12/25/21 - 45.6 %   MCV 86.6 80.0 - 100.0 fL  MCH 30.4 26.0 - 34.0 pg   MCHC 35.1 30.0 - 36.0 g/dL   RDW 03.5 46.5 - 68.1 %   Platelets 197 150 - 400 K/uL   nRBC 0.0 0.0 - 0.2 %  Comprehensive metabolic panel     Status: Abnormal   Collection Time: 12/23/21  4:49 PM  Result Value Ref Range   Sodium 139 135 - 145 mmol/L   Potassium 3.6 3.5 - 5.1 mmol/L   Chloride 110 98 - 111 mmol/L   CO2 23 22 - 32 mmol/L   Glucose, Bld 73 70 - 99 mg/dL   BUN 5 (L) 6 - 20 mg/dL   Creatinine, Ser 2.75 0.44 - 1.00 mg/dL   Calcium 8.7 (L) 8.9 - 10.3 mg/dL   Total Protein 5.9 (L) 6.5 - 8.1 g/dL   Albumin 2.7 (L) 3.5 - 5.0 g/dL   AST 19 15 - 41 U/L   ALT 18 0 - 44 U/L   Alkaline Phosphatase 115 38 - 126 U/L   Total Bilirubin 0.4 0.3 - 1.2 mg/dL   GFR, Estimated >17 >00 mL/min   Anion gap 6 5 - 15  Protein / creatinine ratio, urine     Status: Abnormal   Collection Time: 12/23/21  4:58 PM  Result Value Ref Range   Creatinine, Urine 33.66 mg/dL   Total Protein, Urine 31 mg/dL   Protein Creatinine Ratio 0.92 (H) 0.00 - 0.15 mg/mg[Cre]  Urinalysis, Routine w reflex microscopic     Status: Abnormal   Collection Time: 12/23/21  4:58 PM  Result Value Ref Range   Color, Urine STRAW (A) YELLOW   APPearance CLEAR CLEAR   Specific Gravity, Urine 1.005 1.005 - 1.030   pH 7.0 5.0 - 8.0   Glucose, UA NEGATIVE NEGATIVE mg/dL   Hgb urine dipstick NEGATIVE NEGATIVE   Bilirubin Urine NEGATIVE NEGATIVE   Ketones, ur NEGATIVE NEGATIVE mg/dL   Protein, ur 30 (A) NEGATIVE mg/dL   Nitrite NEGATIVE NEGATIVE   Leukocytes,Ua NEGATIVE NEGATIVE   RBC / HPF 0-5 0 - 5 RBC/hpf   WBC, UA 0-5 0 - 5 WBC/hpf   Bacteria, UA RARE (A) NONE SEEN   Squamous Epithelial / LPF 0-5 0 - 5   Mucus PRESENT    MAU Course  Procedures  MDM Labs ordered and reviewed. Meets criteria for PEC. Will obtain BPP d/t new diagnosis. BPP 8/8, nml AFI. Plan for BP in office on Monday. Stable for discharge home. Dr.  Lorane Gell made aware of clinical findings, dx and need for f/u.  Assessment and Plan   1. [redacted] weeks gestation of pregnancy   2. NST (non-stress test) reactive   3. Pre-eclampsia in third trimester    Discharge home Follow up at [facility] on 5/22 Strict PEC precautions OOW tomorrow-note provided  Allergies as of 12/23/2021   No Known Allergies      Medication List     STOP taking these medications    Azelastine-Fluticasone 137-50 MCG/ACT Susp   buPROPion 150 MG 12 hr tablet Commonly known as: WELLBUTRIN SR   ciprofloxacin-dexamethasone OTIC suspension Commonly known as: Ciprodex   fluconazole 150 MG tablet Commonly known as: DIFLUCAN   miconazole 2 % cream Commonly known as: MICOTIN   montelukast 10 MG tablet Commonly known as: SINGULAIR       TAKE these medications    HAIR SKIN AND NAILS FORMULA PO Take by mouth daily.   PrePLUS 27-1 MG Tabs Take by mouth.  Donette Larry, CNM 12/23/2021, 7:58 PM

## 2021-12-23 NOTE — Progress Notes (Signed)
Written and verbal d/c instructions given and understanding voiced. 

## 2021-12-23 NOTE — MAU Note (Signed)
[name] [name] is a 31 y.o. at [redacted]w[redacted]d here in MAU reporting: BP going up over the past wk.  Elevated in office today. Had protein in her urine at the office,  starting to notice some pain in RUQ. Denies HA, visual changes, reports increase in swelling in her feet today, "woke up with swollen feet".  Denies bleeding or LOF, reports +FM.  Onset of complaint: past wk Pain score: mild Vitals:   12/23/21 1653  BP: (!) 151/106  Pulse: 91  Resp: 16  Temp: 98.6 F (37 C)  SpO2: 99%     FHT:154 Lab orders placed from triage:  urine/ PCR

## 2021-12-29 ENCOUNTER — Encounter (HOSPITAL_COMMUNITY): Payer: Self-pay

## 2021-12-29 ENCOUNTER — Telehealth (HOSPITAL_COMMUNITY): Payer: Self-pay | Admitting: *Deleted

## 2021-12-29 NOTE — Telephone Encounter (Signed)
Preadmission screen  

## 2021-12-30 ENCOUNTER — Encounter (HOSPITAL_COMMUNITY): Payer: Self-pay | Admitting: *Deleted

## 2022-01-04 LAB — OB RESULTS CONSOLE GBS: GBS: NEGATIVE

## 2022-01-10 ENCOUNTER — Inpatient Hospital Stay (HOSPITAL_COMMUNITY): Payer: BC Managed Care – PPO | Admitting: Anesthesiology

## 2022-01-10 ENCOUNTER — Inpatient Hospital Stay (HOSPITAL_COMMUNITY): Payer: BC Managed Care – PPO

## 2022-01-10 ENCOUNTER — Inpatient Hospital Stay (HOSPITAL_COMMUNITY)
Admission: AD | Admit: 2022-01-10 | Discharge: 2022-01-13 | DRG: 786 | Disposition: A | Discharge status: Home / Self Care | Payer: BC Managed Care – PPO | Attending: Obstetrics and Gynecology | Admitting: Obstetrics and Gynecology

## 2022-01-10 ENCOUNTER — Encounter (HOSPITAL_COMMUNITY)
Admission: AD | Disposition: A | Discharge status: Home / Self Care | Payer: Self-pay | Source: Home / Self Care | Attending: Obstetrics and Gynecology

## 2022-01-10 DIAGNOSIS — Z87891 Personal history of nicotine dependence: Secondary | ICD-10-CM

## 2022-01-10 DIAGNOSIS — O134 Gestational [pregnancy-induced] hypertension without significant proteinuria, complicating childbirth: Secondary | ICD-10-CM | POA: Diagnosis present

## 2022-01-10 DIAGNOSIS — O43123 Velamentous insertion of umbilical cord, third trimester: Secondary | ICD-10-CM | POA: Diagnosis present

## 2022-01-10 DIAGNOSIS — O133 Gestational [pregnancy-induced] hypertension without significant proteinuria, third trimester: Principal | ICD-10-CM | POA: Diagnosis present

## 2022-01-10 DIAGNOSIS — Z3A37 37 weeks gestation of pregnancy: Secondary | ICD-10-CM

## 2022-01-10 DIAGNOSIS — O411230 Chorioamnionitis, third trimester, not applicable or unspecified: Secondary | ICD-10-CM | POA: Diagnosis present

## 2022-01-10 LAB — COMPREHENSIVE METABOLIC PANEL
ALT: 18 U/L (ref 0–44)
AST: 21 U/L (ref 15–41)
Albumin: 2.6 g/dL — ABNORMAL LOW (ref 3.5–5.0)
Alkaline Phosphatase: 157 U/L — ABNORMAL HIGH (ref 38–126)
Anion gap: 10 (ref 5–15)
BUN: 8 mg/dL (ref 6–20)
CO2: 19 mmol/L — ABNORMAL LOW (ref 22–32)
Calcium: 9.3 mg/dL (ref 8.9–10.3)
Chloride: 107 mmol/L (ref 98–111)
Creatinine, Ser: 0.65 mg/dL (ref 0.44–1.00)
GFR, Estimated: >60 mL/min (ref >60)
Glucose, Bld: 79 mg/dL (ref 70–99)
Potassium: 3.6 mmol/L (ref 3.5–5.1)
Sodium: 136 mmol/L (ref 135–145)
Total Bilirubin: 0.5 mg/dL (ref 0.3–1.2)
Total Protein: 6.2 g/dL — ABNORMAL LOW (ref 6.5–8.1)

## 2022-01-10 LAB — CBC
HCT: 40.3 % (ref 36.0–46.0)
HCT: 38.4 % (ref 36.0–46.0)
Hemoglobin: 14.3 g/dL (ref 12.0–15.0)
Hemoglobin: 13.5 g/dL (ref 12.0–15.0)
MCH: 30.9 pg (ref 26.0–34.0)
MCH: 30.5 pg (ref 26.0–34.0)
MCHC: 35.5 g/dL (ref 30.0–36.0)
MCHC: 35.2 g/dL (ref 30.0–36.0)
MCV: 87 fL (ref 80.0–100.0)
MCV: 86.9 fL (ref 80.0–100.0)
Platelets: 225 10*3/uL (ref 150–400)
Platelets: 197 10*3/uL (ref 150–400)
RBC: 4.63 MIL/uL (ref 3.87–5.11)
RBC: 4.42 MIL/uL (ref 3.87–5.11)
RDW: 12.1 % (ref 11.5–15.5)
RDW: 12 % (ref 11.5–15.5)
WBC: 11 10*3/uL — ABNORMAL HIGH (ref 4.0–10.5)
WBC: 11.8 10*3/uL — ABNORMAL HIGH (ref 4.0–10.5)
nRBC: 0 % (ref 0.0–0.2)
nRBC: 0 % (ref 0.0–0.2)

## 2022-01-10 LAB — TYPE AND SCREEN
ABO/RH(D): B POS
Antibody Screen: NEGATIVE

## 2022-01-10 LAB — RPR: RPR Ser Ql: NONREACTIVE

## 2022-01-10 SURGERY — Surgical Case
Anesthesia: Epidural | Wound class: Clean Contaminated

## 2022-01-10 MED ORDER — OXYTOCIN-SODIUM CHLORIDE 30-0.9 UT/500ML-% IV SOLN
1.0000 m[IU]/min | INTRAVENOUS | Status: DC
  Administered 2022-01-10 (×2): 2 m[IU]/min via INTRAVENOUS
  Filled 2022-01-10: qty 500

## 2022-01-10 MED ORDER — SOD CITRATE-CITRIC ACID 500-334 MG/5ML PO SOLN
30.0000 mL | ORAL | Status: DC | PRN
  Administered 2022-01-10: 30 mL via ORAL
  Filled 2022-01-10: qty 30

## 2022-01-10 MED ORDER — EPHEDRINE 5 MG/ML INJ: 10.0000 mg | INTRAVENOUS | Status: DC | PRN

## 2022-01-10 MED ORDER — TERBUTALINE SULFATE 1 MG/ML IJ SOLN: 0.2500 mg | Freq: Once | INTRAMUSCULAR | Status: DC | PRN

## 2022-01-10 MED ORDER — FENTANYL-BUPIVACAINE-NACL 0.5-0.125-0.9 MG/250ML-% EP SOLN
EPIDURAL | Status: DC | PRN
  Administered 2022-01-10: 12 mL/h via EPIDURAL

## 2022-01-10 MED ORDER — HYDRALAZINE HCL 20 MG/ML IJ SOLN: 10.0000 mg | INTRAMUSCULAR | Status: DC | PRN

## 2022-01-10 MED ORDER — ACETAMINOPHEN 325 MG PO TABS
650.0000 mg | ORAL_TABLET | ORAL | Status: DC | PRN
  Administered 2022-01-10: 650 mg via ORAL
  Filled 2022-01-10: qty 2

## 2022-01-10 MED ORDER — LABETALOL HCL 5 MG/ML IV SOLN
20.0000 mg | INTRAVENOUS | Status: DC | PRN
  Filled 2022-01-10: qty 4

## 2022-01-10 MED ORDER — GENTAMICIN SULFATE 40 MG/ML IJ SOLN
5.0000 mg/kg | INTRAVENOUS | Status: DC
  Administered 2022-01-10: 310 mg via INTRAVENOUS
  Filled 2022-01-10: qty 7.75

## 2022-01-10 MED ORDER — TERBUTALINE SULFATE 1 MG/ML IJ SOLN
0.2500 mg | Freq: Once | INTRAMUSCULAR | Status: DC | PRN
Start: 2022-01-10 — End: 2022-01-11

## 2022-01-10 MED ORDER — SODIUM CHLORIDE 0.9 % IV SOLN
INTRAVENOUS | Status: AC
  Filled 2022-01-10: qty 2

## 2022-01-10 MED ORDER — PHENYLEPHRINE 80 MCG/ML (10ML) SYRINGE FOR IV PUSH (FOR BLOOD PRESSURE SUPPORT): 80.0000 ug | PREFILLED_SYRINGE | INTRAVENOUS | Status: DC | PRN

## 2022-01-10 MED ORDER — LACTATED RINGERS IV SOLN: INTRAVENOUS | Status: DC

## 2022-01-10 MED ORDER — DIPHENHYDRAMINE HCL 50 MG/ML IJ SOLN: 12.5000 mg | INTRAMUSCULAR | Status: DC | PRN

## 2022-01-10 MED ORDER — LABETALOL HCL 5 MG/ML IV SOLN
80.0000 mg | INTRAVENOUS | Status: DC | PRN
Start: 2022-01-10 — End: 2022-01-11

## 2022-01-10 MED ORDER — LACTATED RINGERS IV SOLN
500.0000 mL | INTRAVENOUS | Status: DC | PRN
  Administered 2022-01-10: 260 mL via INTRAVENOUS

## 2022-01-10 MED ORDER — OXYTOCIN-SODIUM CHLORIDE 30-0.9 UT/500ML-% IV SOLN: 2.5000 [IU]/h | INTRAVENOUS | Status: DC

## 2022-01-10 MED ORDER — ONDANSETRON HCL 4 MG/2ML IJ SOLN
4.0000 mg | Freq: Four times a day (QID) | INTRAMUSCULAR | Status: DC | PRN
  Administered 2022-01-10: 4 mg via INTRAVENOUS
  Filled 2022-01-10: qty 2

## 2022-01-10 MED ORDER — OXYCODONE-ACETAMINOPHEN 5-325 MG PO TABS: 1.0000 | ORAL_TABLET | ORAL | Status: DC | PRN

## 2022-01-10 MED ORDER — SODIUM CHLORIDE 0.9 % IV SOLN
2.0000 g | Freq: Two times a day (BID) | INTRAVENOUS | Status: AC
  Administered 2022-01-11 – 2022-01-12 (×3): 2 g via INTRAVENOUS
  Filled 2022-01-10 (×4): qty 2

## 2022-01-10 MED ORDER — OXYTOCIN-SODIUM CHLORIDE 30-0.9 UT/500ML-% IV SOLN
INTRAVENOUS | Status: AC
  Filled 2022-01-10: qty 500

## 2022-01-10 MED ORDER — LIDOCAINE HCL (PF) 1 % IJ SOLN
INTRAMUSCULAR | Status: DC | PRN
  Administered 2022-01-10: 6 mL via EPIDURAL
  Administered 2022-01-10: 4 mL via EPIDURAL

## 2022-01-10 MED ORDER — FENTANYL-BUPIVACAINE-NACL 0.5-0.125-0.9 MG/250ML-% EP SOLN
12.0000 mL/h | EPIDURAL | Status: DC | PRN
  Filled 2022-01-10: qty 250

## 2022-01-10 MED ORDER — OXYCODONE-ACETAMINOPHEN 5-325 MG PO TABS: 2.0000 | ORAL_TABLET | ORAL | Status: DC | PRN

## 2022-01-10 MED ORDER — MORPHINE SULFATE (PF) 0.5 MG/ML IJ SOLN
INTRAMUSCULAR | Status: AC
  Filled 2022-01-10: qty 10

## 2022-01-10 MED ORDER — SODIUM CHLORIDE 0.9 % IV SOLN: 2.0000 g | Freq: Four times a day (QID) | INTRAVENOUS | Status: DC

## 2022-01-10 MED ORDER — SODIUM CHLORIDE 0.9 % IV SOLN
2.0000 g | Freq: Four times a day (QID) | INTRAVENOUS | Status: DC
  Administered 2022-01-10: 2 g via INTRAVENOUS
  Filled 2022-01-10: qty 2000

## 2022-01-10 MED ORDER — SODIUM CHLORIDE 0.9 % IV SOLN
500.0000 mg | Freq: Once | INTRAVENOUS | Status: AC
  Administered 2022-01-11: 500 mg via INTRAVENOUS

## 2022-01-10 MED ORDER — LABETALOL HCL 5 MG/ML IV SOLN: 40.0000 mg | INTRAVENOUS | Status: DC | PRN

## 2022-01-10 MED ORDER — FENTANYL CITRATE (PF) 100 MCG/2ML IJ SOLN
50.0000 ug | INTRAMUSCULAR | Status: DC | PRN
  Administered 2022-01-10: 100 ug via INTRAVENOUS
  Filled 2022-01-10: qty 2

## 2022-01-10 MED ORDER — LACTATED RINGERS IV SOLN: 500.0000 mL | Freq: Once | INTRAVENOUS | Status: DC

## 2022-01-10 MED ORDER — FENTANYL CITRATE (PF) 100 MCG/2ML IJ SOLN
INTRAMUSCULAR | Status: AC
  Filled 2022-01-10: qty 2

## 2022-01-10 MED ORDER — LIDOCAINE HCL (PF) 1 % IJ SOLN: 30.0000 mL | INTRAMUSCULAR | Status: DC | PRN

## 2022-01-10 MED ORDER — MISOPROSTOL 50MCG HALF TABLET
50.0000 ug | ORAL_TABLET | ORAL | Status: AC
  Administered 2022-01-10 (×2): 50 ug via ORAL
  Filled 2022-01-10 (×2): qty 1

## 2022-01-10 MED ORDER — OXYTOCIN BOLUS FROM INFUSION: 333.0000 mL | Freq: Once | INTRAVENOUS | Status: DC

## 2022-01-10 SURGICAL SUPPLY — 27 items
CHLORAPREP W/TINT 26ML (MISCELLANEOUS) ×4 IMPLANT
CLAMP CORD UMBIL (MISCELLANEOUS) ×2 IMPLANT
CLOTH BEACON ORANGE TIMEOUT ST (SAFETY) ×2 IMPLANT
DRSG OPSITE POSTOP 4X10 (GAUZE/BANDAGES/DRESSINGS) ×3 IMPLANT
ELECT REM PT RETURN 9FT ADLT (ELECTROSURGICAL) ×2
ELECTRODE REM PT RTRN 9FT ADLT (ELECTROSURGICAL) ×1 IMPLANT
EXTRACTOR VACUUM M CUP 4 TUBE (SUCTIONS) IMPLANT
GLOVE BIOGEL PI IND STRL 7.0 (GLOVE) ×1 IMPLANT
GLOVE BIOGEL PI INDICATOR 7.0 (GLOVE) ×1
GLOVE SURG ORTHO 8.0 STRL STRW (GLOVE) ×2 IMPLANT
GOWN STRL REUS W/TWL LRG LVL3 (GOWN DISPOSABLE) ×4 IMPLANT
KIT ABG SYR 3ML LUER SLIP (SYRINGE) ×2 IMPLANT
NDL HYPO 25X5/8 SAFETYGLIDE (NEEDLE) ×1 IMPLANT
NEEDLE HYPO 25X5/8 SAFETYGLIDE (NEEDLE) ×2 IMPLANT
NS IRRIG 1000ML POUR BTL (IV SOLUTION) ×2 IMPLANT
PACK C SECTION WH (CUSTOM PROCEDURE TRAY) ×2 IMPLANT
PAD OB MATERNITY 4.3X12.25 (PERSONAL CARE ITEMS) ×2 IMPLANT
SUT MNCRL 0 VIOLET CTX 36 (SUTURE) ×3 IMPLANT
SUT MON AB 4-0 PS1 27 (SUTURE) ×2 IMPLANT
SUT MONOCRYL 0 CTX 36 (SUTURE) ×6
SUT PDS AB 1 CT  36 (SUTURE)
SUT PDS AB 1 CT 36 (SUTURE) IMPLANT
SUT VIC AB 1 CTX 36 (SUTURE)
SUT VIC AB 1 CTX36XBRD ANBCTRL (SUTURE) IMPLANT
TOWEL OR 17X24 6PK STRL BLUE (TOWEL DISPOSABLE) ×2 IMPLANT
TRAY FOLEY W/BAG SLVR 14FR LF (SET/KITS/TRAYS/PACK) ×2 IMPLANT
WATER STERILE IRR 1000ML POUR (IV SOLUTION) ×2 IMPLANT

## 2022-01-10 NOTE — Progress Notes (Signed)
Pharmacy Antibiotic Note  [name] [name] is a 31 y.o. female admitted on 01/10/2022 for IOL due to gestational HTN at [redacted]w[redacted]d.  Pharmacy has been consulted for Gentamicin dosing for chorioamnionitis/ Triple I  Plan: Gentamicin 5mg /kg (310mg ) IV q24h Will continue to follow  Height: 5\' 4"  (162.6 cm) Weight: 73.9 kg (162 lb 14.4 oz) IBW/kg (Calculated) : 54.7 ADW/Dosing weight: 62.4kg  Temp (24hrs), Avg:98.6 F (37 C), Min:97.4 F (36.3 C), Max:100.3 F (37.9 C)  Recent Labs  Lab 01/10/22 0015 01/10/22 1124  WBC 11.0* 11.8*  CREATININE 0.65  --     Estimated Creatinine Clearance: 103.1 mL/min (by C-G formula based on SCr of 0.65 mg/dL).    No Known Allergies  Antimicrobials this admission: Ampicillin 2 gram IV q6h  6/5 >>    Thank you for allowing pharmacy to be a part of this patient's care.  01/10/2022 9:59 PM

## 2022-01-10 NOTE — Anesthesia Preprocedure Evaluation (Signed)
Anesthesia Evaluation  Patient identified by MRN, date of birth, ID band Patient awake    Reviewed: Allergy & Precautions, H&P , NPO status , Patient's Chart, lab work & pertinent test results  History of Anesthesia Complications Negative for: history of anesthetic complications  Airway Mallampati: II  TM Distance: >3 FB     Dental   Pulmonary neg pulmonary ROS, former smoker,    Pulmonary exam normal        Cardiovascular hypertension,  Rhythm:regular Rate:Normal     Neuro/Psych negative neurological ROS  negative psych ROS   GI/Hepatic negative GI ROS, Neg liver ROS,   Endo/Other  negative endocrine ROS  Renal/GU negative Renal ROS  negative genitourinary   Musculoskeletal   Abdominal   Peds  Hematology negative hematology ROS (+)   Anesthesia Other Findings preeclampsia  Reproductive/Obstetrics (+) Pregnancy                             Anesthesia Physical Anesthesia Plan  ASA: 3  Anesthesia Plan: Epidural   Post-op Pain Management:    Induction:   PONV Risk Score and Plan:   Airway Management Planned:   Additional Equipment:   Intra-op Plan:   Post-operative Plan:   Informed Consent: I have reviewed the patients History and Physical, chart, labs and discussed the procedure including the risks, benefits and alternatives for the proposed anesthesia with the patient or authorized representative who has indicated his/her understanding and acceptance.       Plan Discussed with:   Anesthesia Plan Comments:         Anesthesia Quick Evaluation

## 2022-01-10 NOTE — Progress Notes (Signed)
Patient ID: [name] [name], female   DOB: July 13, 1991, 31 y.o.   MRN: [id number] Protracted labor with chorioaminitis and fetal intolerance to labor Amp/Gent given 1 hour ago Recurrent late decels previously responding to stopping pitocin and IVF FHR 160s cat 3 Recurrent late decels and moderate variables Cx 8/c/-1 LOP no change  Now with recurrent late decels, fetal tachycardia and no cervical change Discussed options with [name] and her husband I recommend and they want a primary cesarean sectionRisks and benefits of C/S were discussed.  All questions were answered and informed consent was obtained.  Plan to proceed with low segment transverse Cesarean Section.  I discussed abx coverage with Sue Lush in pharmacy.  Started amp/gent for chorio and because clindamycin is back ordered, they recommended cefotetan and will cont that for 24 hours post op.  Also add zithromycin 500mg  x 1 per current c/s recommendations DL

## 2022-01-10 NOTE — Anesthesia Procedure Notes (Signed)
Epidural Patient location during procedure: OB Start time: 01/10/2022 11:59 AM End time: 01/10/2022 12:08 PM  Staffing Anesthesiologist: Lucretia Kern, MD Performed: anesthesiologist   Preanesthetic Checklist Completed: patient identified, IV checked, risks and benefits discussed, monitors and equipment checked, pre-op evaluation and timeout performed  Epidural Patient position: sitting Prep: DuraPrep Patient monitoring: heart rate, continuous pulse ox and blood pressure Approach: midline Location: L3-L4 Injection technique: LOR air  Needle:  Needle type: Tuohy  Needle gauge: 17 G Needle length: 9 cm Needle insertion depth: 7 cm Catheter type: closed end flexible Catheter size: 19 Gauge Catheter at skin depth: 12 cm Test dose: negative  Assessment Events: blood not aspirated, injection not painful, no injection resistance, no paresthesia and negative IV test  Additional Notes Reason for block:procedure for pain

## 2022-01-10 NOTE — H&P (Signed)
[name] [name] is a 31 y.o. female presenting for induction of labor due to Gestational HTN without severe sxs.  Pregancy complicated by IVF preg,  2VC and marginal insertion and last US showed EFW 2525gm (10%) at 36 6/7.  GBS neg OB History     Gravida  2   Para      Term      Preterm      AB  1   Living  0      SAB      IAB      Ectopic  1   Multiple      Live Births             Past Medical History:  Diagnosis Date   Complication of anesthesia    slow to wake   Ectopic pregnancy    Family history of adverse reaction to anesthesia    Mother - slow to wake   Infection    UTI   Ovarian cyst    Preeclampsia    Past Surgical History:  Procedure Laterality Date   HERNIA REPAIR     KNEE SURGERY Right    LAPAROSCOPY     LAPAROSCOPY N/A 02/19/2016   Procedure: LAPAROSCOPY DIAGNOSTIC WITH RIGHT SALPINGECTOMY;  Surgeon: Mitchel Honour, DO;  Location: [facility];  Service: Gynecology;  Laterality: N/A;   MYRINGOTOMY WITH TUBE PLACEMENT Bilateral 10/19/2016   Procedure: MYRINGOTOMY WITH TUBE PLACEMENT  butterfly tubes;  Surgeon: Bud Face, MD;  Location: [facility];  Service: ENT;  Laterality: Bilateral;  butterfly tubes   WISDOM TOOTH EXTRACTION     Family History: family history includes COPD in her paternal grandfather; Diabetes in her maternal grandfather; Healthy in her father and mother; Heart disease in her paternal grandfather and paternal grandmother; Hyperlipidemia in her paternal grandfather and paternal grandmother; Hypertension in her father and mother; Stroke in her paternal grandmother. Social History:  reports that she has quit smoking. Her smoking use included cigarettes. She has a 2.00 pack-year smoking history. She has never used smokeless tobacco. She reports current alcohol use of about 5.0 standard drinks per week. She reports that she does not use drugs.     Maternal Diabetes: No Genetic Screening: Normal Maternal Ultrasounds/Referrals:  Normal Fetal Ultrasounds or other Referrals:  Referred to Materal Fetal Medicine  Maternal Substance Abuse:  No Significant Maternal Medications:  None Significant Maternal Lab Results:  Group B Strep negative Other Comments:  None  Review of Systems History Dilation: 1.5 Effacement (%): 40 Station: -3 Exam by:: Dr Rana Snare Attempt AROM with poss return of clear fluid.  Pt couldn't tolerate exam for another try Blood pressure (!) 154/97, pulse 71, temperature (!) 97.4 F (36.3 C), temperature source Oral, resp. rate 18, height 5\' 4"  (1.626 m), weight 73.9 kg, last menstrual period 05/06/2020, unknown if currently breastfeeding. Exam Physical Exam  Vitals and nursing note reviewed. Exam conducted with a chaperone present.  Constitutional:      Appearance: Normal appearance.  HENT:     Head: Normocephalic.  Eyes:     Pupils: Pupils are equal, round, and reactive to light.  Cardiovascular:     Rate and Rhythm: Normal rate and regular rhythm.     Pulses: Normal pulses.  Abdominal:     General: Abdomen is Gravid, nontender Neurological:     Mental Status: She is alert.  Prenatal labs: ABO, Rh: --/--/B POS (06/05 02-14-1972) Antibody: NEG (06/05 0213) Rubella: Immune (11/11 0000) RPR: NON REACTIVE (06/05 0015)  HBsAg: Negative (11/11 0000)  HIV: Non-reactive (11/11 0000)  GBS:     Assessment/Plan: IUP at [redacted] weeks Gestational HTN (pre eclampsia) wtihout severe features.  Normal labs Will monitor BPs and sxs S/p cytotec x 2, now with poss AROM.  Pitocin begin at 9am Anticipating CLEA and SVD  [name] [name] 01/10/2022, 8:07 AM

## 2022-01-11 ENCOUNTER — Encounter (HOSPITAL_COMMUNITY): Payer: Self-pay | Admitting: Obstetrics and Gynecology

## 2022-01-11 LAB — COMPREHENSIVE METABOLIC PANEL
ALT: 19 U/L (ref 0–44)
AST: 26 U/L (ref 15–41)
Albumin: 1.9 g/dL — ABNORMAL LOW (ref 3.5–5.0)
Alkaline Phosphatase: 105 U/L (ref 38–126)
Anion gap: 9 (ref 5–15)
BUN: 13 mg/dL (ref 6–20)
CO2: 20 mmol/L — ABNORMAL LOW (ref 22–32)
Calcium: 8.1 mg/dL — ABNORMAL LOW (ref 8.9–10.3)
Chloride: 108 mmol/L (ref 98–111)
Creatinine, Ser: 1.02 mg/dL — ABNORMAL HIGH (ref 0.44–1.00)
GFR, Estimated: >60 mL/min (ref >60)
Glucose, Bld: 137 mg/dL — ABNORMAL HIGH (ref 70–99)
Potassium: 4.2 mmol/L (ref 3.5–5.1)
Sodium: 137 mmol/L (ref 135–145)
Total Bilirubin: 0.6 mg/dL (ref 0.3–1.2)
Total Protein: 5 g/dL — ABNORMAL LOW (ref 6.5–8.1)

## 2022-01-11 LAB — CBC
HCT: 32.7 % — ABNORMAL LOW (ref 36.0–46.0)
HCT: 34 % — ABNORMAL LOW (ref 36.0–46.0)
HCT: 31.8 % — ABNORMAL LOW (ref 36.0–46.0)
Hemoglobin: 11.1 g/dL — ABNORMAL LOW (ref 12.0–15.0)
Hemoglobin: 12 g/dL (ref 12.0–15.0)
Hemoglobin: 11.3 g/dL — ABNORMAL LOW (ref 12.0–15.0)
MCH: 30 pg (ref 26.0–34.0)
MCH: 30.8 pg (ref 26.0–34.0)
MCH: 30.9 pg (ref 26.0–34.0)
MCHC: 33.9 g/dL (ref 30.0–36.0)
MCHC: 35.3 g/dL (ref 30.0–36.0)
MCHC: 35.5 g/dL (ref 30.0–36.0)
MCV: 88.4 fL (ref 80.0–100.0)
MCV: 87.2 fL (ref 80.0–100.0)
MCV: 86.9 fL (ref 80.0–100.0)
Platelets: 155 10*3/uL (ref 150–400)
Platelets: 172 10*3/uL (ref 150–400)
Platelets: 182 10*3/uL (ref 150–400)
RBC: 3.7 MIL/uL — ABNORMAL LOW (ref 3.87–5.11)
RBC: 3.9 MIL/uL (ref 3.87–5.11)
RBC: 3.66 MIL/uL — ABNORMAL LOW (ref 3.87–5.11)
RDW: 12.2 % (ref 11.5–15.5)
RDW: 12.3 % (ref 11.5–15.5)
RDW: 12.3 % (ref 11.5–15.5)
WBC: 22.7 10*3/uL — ABNORMAL HIGH (ref 4.0–10.5)
WBC: 25.5 10*3/uL — ABNORMAL HIGH (ref 4.0–10.5)
WBC: 24.3 10*3/uL — ABNORMAL HIGH (ref 4.0–10.5)
nRBC: 0 % (ref 0.0–0.2)
nRBC: 0 % (ref 0.0–0.2)
nRBC: 0 % (ref 0.0–0.2)

## 2022-01-11 MED ORDER — TRANEXAMIC ACID-NACL 1000-0.7 MG/100ML-% IV SOLN
INTRAVENOUS | Status: DC | PRN
  Administered 2022-01-11: 1000 mg via INTRAVENOUS

## 2022-01-11 MED ORDER — OXYCODONE-ACETAMINOPHEN 5-325 MG PO TABS: 1.0000 | ORAL_TABLET | ORAL | Status: DC | PRN

## 2022-01-11 MED ORDER — ACETAMINOPHEN 160 MG/5ML PO SOLN: 325.0000 mg | ORAL | Status: DC | PRN

## 2022-01-11 MED ORDER — ONDANSETRON HCL 4 MG/2ML IJ SOLN
INTRAMUSCULAR | Status: DC | PRN
  Administered 2022-01-11: 4 mg via INTRAVENOUS

## 2022-01-11 MED ORDER — DIPHENHYDRAMINE HCL 25 MG PO CAPS: 25.0000 mg | ORAL_CAPSULE | ORAL | Status: DC | PRN

## 2022-01-11 MED ORDER — MENTHOL 3 MG MT LOZG: 1.0000 | LOZENGE | OROMUCOSAL | Status: DC | PRN

## 2022-01-11 MED ORDER — TETANUS-DIPHTH-ACELL PERTUSSIS 5-2.5-18.5 LF-MCG/0.5 IM SUSY: 0.5000 mL | PREFILLED_SYRINGE | Freq: Once | INTRAMUSCULAR | Status: DC

## 2022-01-11 MED ORDER — PRENATAL MULTIVITAMIN CH
1.0000 | ORAL_TABLET | Freq: Every day | ORAL | Status: DC
  Administered 2022-01-11 – 2022-01-12 (×2): 1 via ORAL
  Filled 2022-01-11 (×3): qty 1

## 2022-01-11 MED ORDER — MEASLES, MUMPS & RUBELLA VAC IJ SOLR: 0.5000 mL | Freq: Once | INTRAMUSCULAR | Status: DC

## 2022-01-11 MED ORDER — OXYTOCIN-SODIUM CHLORIDE 30-0.9 UT/500ML-% IV SOLN: 2.5000 [IU]/h | INTRAVENOUS | Status: AC

## 2022-01-11 MED ORDER — ACETAMINOPHEN 325 MG PO TABS: 325.0000 mg | ORAL_TABLET | ORAL | Status: DC | PRN

## 2022-01-11 MED ORDER — SCOPOLAMINE 1 MG/3DAYS TD PT72
1.0000 | MEDICATED_PATCH | Freq: Once | TRANSDERMAL | Status: DC
  Administered 2022-01-11: 1.5 mg via TRANSDERMAL

## 2022-01-11 MED ORDER — DIPHENHYDRAMINE HCL 50 MG/ML IJ SOLN: 12.5000 mg | INTRAMUSCULAR | Status: DC | PRN

## 2022-01-11 MED ORDER — ZOLPIDEM TARTRATE 5 MG PO TABS: 5.0000 mg | ORAL_TABLET | Freq: Every evening | ORAL | Status: DC | PRN

## 2022-01-11 MED ORDER — OXYTOCIN-SODIUM CHLORIDE 30-0.9 UT/500ML-% IV SOLN
INTRAVENOUS | Status: DC | PRN
  Administered 2022-01-11: 30 [IU] via INTRAVENOUS

## 2022-01-11 MED ORDER — MEPERIDINE HCL 25 MG/ML IJ SOLN: 6.2500 mg | INTRAMUSCULAR | Status: DC | PRN

## 2022-01-11 MED ORDER — WITCH HAZEL-GLYCERIN EX PADS: 1.0000 "application " | MEDICATED_PAD | CUTANEOUS | Status: DC | PRN

## 2022-01-11 MED ORDER — FENTANYL CITRATE (PF) 100 MCG/2ML IJ SOLN
INTRAMUSCULAR | Status: DC | PRN
  Administered 2022-01-10: 100 ug via EPIDURAL

## 2022-01-11 MED ORDER — TRANEXAMIC ACID-NACL 1000-0.7 MG/100ML-% IV SOLN
INTRAVENOUS | Status: AC
  Filled 2022-01-11: qty 100

## 2022-01-11 MED ORDER — ACETAMINOPHEN 325 MG PO TABS
650.0000 mg | ORAL_TABLET | ORAL | Status: DC | PRN
  Administered 2022-01-11 – 2022-01-13 (×8): 650 mg via ORAL
  Filled 2022-01-11 (×10): qty 2

## 2022-01-11 MED ORDER — DIPHENHYDRAMINE HCL 25 MG PO CAPS: 25.0000 mg | ORAL_CAPSULE | Freq: Four times a day (QID) | ORAL | Status: DC | PRN

## 2022-01-11 MED ORDER — LACTATED RINGERS IV SOLN: INTRAVENOUS | Status: DC

## 2022-01-11 MED ORDER — SCOPOLAMINE 1 MG/3DAYS TD PT72
MEDICATED_PATCH | TRANSDERMAL | Status: AC
  Filled 2022-01-11: qty 1

## 2022-01-11 MED ORDER — SENNOSIDES-DOCUSATE SODIUM 8.6-50 MG PO TABS
2.0000 | ORAL_TABLET | ORAL | Status: DC
  Administered 2022-01-11 – 2022-01-12 (×2): 2 via ORAL
  Filled 2022-01-11 (×3): qty 2

## 2022-01-11 MED ORDER — ONDANSETRON HCL 4 MG/2ML IJ SOLN: 4.0000 mg | Freq: Three times a day (TID) | INTRAMUSCULAR | Status: DC | PRN

## 2022-01-11 MED ORDER — OXYCODONE HCL 5 MG PO TABS: 5.0000 mg | ORAL_TABLET | Freq: Once | ORAL | Status: DC | PRN

## 2022-01-11 MED ORDER — FENTANYL CITRATE (PF) 100 MCG/2ML IJ SOLN: 25.0000 ug | INTRAMUSCULAR | Status: DC | PRN

## 2022-01-11 MED ORDER — LACTATED RINGERS IV BOLUS: 500.0000 mL | Freq: Once | INTRAVENOUS | Status: DC

## 2022-01-11 MED ORDER — NALOXONE HCL 0.4 MG/ML IJ SOLN: 0.4000 mg | INTRAMUSCULAR | Status: DC | PRN

## 2022-01-11 MED ORDER — KETOROLAC TROMETHAMINE 30 MG/ML IJ SOLN
30.0000 mg | Freq: Four times a day (QID) | INTRAMUSCULAR | Status: AC | PRN
  Administered 2022-01-11 (×3): 30 mg via INTRAVENOUS
  Filled 2022-01-11 (×2): qty 1

## 2022-01-11 MED ORDER — DEXAMETHASONE SODIUM PHOSPHATE 10 MG/ML IJ SOLN
INTRAMUSCULAR | Status: DC | PRN
  Administered 2022-01-11: 10 mg via INTRAVENOUS

## 2022-01-11 MED ORDER — SODIUM CHLORIDE 0.9% FLUSH: 3.0000 mL | INTRAVENOUS | Status: DC | PRN

## 2022-01-11 MED ORDER — MORPHINE SULFATE (PF) 0.5 MG/ML IJ SOLN
INTRAMUSCULAR | Status: DC | PRN
  Administered 2022-01-11: 3 mg via EPIDURAL

## 2022-01-11 MED ORDER — IBUPROFEN 600 MG PO TABS
600.0000 mg | ORAL_TABLET | Freq: Four times a day (QID) | ORAL | Status: DC | PRN
  Administered 2022-01-12 – 2022-01-13 (×5): 600 mg via ORAL
  Filled 2022-01-11 (×5): qty 1

## 2022-01-11 MED ORDER — DIBUCAINE (PERIANAL) 1 % EX OINT
1.0000 | TOPICAL_OINTMENT | CUTANEOUS | Status: DC | PRN
Start: 2022-01-11 — End: 2022-01-13

## 2022-01-11 MED ORDER — LABETALOL HCL 100 MG PO TABS
100.0000 mg | ORAL_TABLET | Freq: Three times a day (TID) | ORAL | Status: DC
  Administered 2022-01-11 – 2022-01-13 (×6): 100 mg via ORAL
  Filled 2022-01-11 (×6): qty 1

## 2022-01-11 MED ORDER — KETOROLAC TROMETHAMINE 30 MG/ML IJ SOLN
30.0000 mg | Freq: Four times a day (QID) | INTRAMUSCULAR | Status: AC | PRN
Start: 2022-01-11 — End: 2022-01-12

## 2022-01-11 MED ORDER — ONDANSETRON HCL 4 MG/2ML IJ SOLN: 4.0000 mg | Freq: Once | INTRAMUSCULAR | Status: DC | PRN

## 2022-01-11 MED ORDER — SIMETHICONE 80 MG PO CHEW: 80.0000 mg | CHEWABLE_TABLET | ORAL | Status: DC | PRN

## 2022-01-11 MED ORDER — OXYCODONE HCL 5 MG/5ML PO SOLN: 5.0000 mg | Freq: Once | ORAL | Status: DC | PRN

## 2022-01-11 MED ORDER — COCONUT OIL OIL: 1.0000 "application " | TOPICAL_OIL | Status: DC | PRN

## 2022-01-11 MED ORDER — STERILE WATER FOR IRRIGATION IR SOLN
Status: DC | PRN
  Administered 2022-01-11: 1

## 2022-01-11 MED ORDER — SIMETHICONE 80 MG PO CHEW
80.0000 mg | CHEWABLE_TABLET | Freq: Three times a day (TID) | ORAL | Status: DC
  Administered 2022-01-11 – 2022-01-13 (×7): 80 mg via ORAL
  Filled 2022-01-11 (×7): qty 1

## 2022-01-11 MED ORDER — NALOXONE HCL 4 MG/10ML IJ SOLN: 1.0000 ug/kg/h | INTRAVENOUS | Status: DC | PRN

## 2022-01-11 MED ORDER — LIDOCAINE-EPINEPHRINE (PF) 2 %-1:200000 IJ SOLN
INTRAMUSCULAR | Status: DC | PRN
  Administered 2022-01-10 – 2022-01-11 (×4): 5 mL

## 2022-01-11 MED ORDER — KETOROLAC TROMETHAMINE 30 MG/ML IJ SOLN
INTRAMUSCULAR | Status: AC
  Filled 2022-01-11: qty 1

## 2022-01-11 MED ORDER — DEXMEDETOMIDINE HCL IN NACL 80 MCG/20ML IV SOLN
INTRAVENOUS | Status: AC
  Filled 2022-01-11: qty 20

## 2022-01-11 NOTE — Anesthesia Postprocedure Evaluation (Signed)
Anesthesia Post Note  [name]  Procedure(s) Performed: CESAREAN SECTION     Patient location during evaluation: Mother Baby Anesthesia Type: Epidural Level of consciousness: awake and alert Pain management: pain level controlled Vital Signs Assessment: post-procedure vital signs reviewed and stable Respiratory status: spontaneous breathing, nonlabored ventilation and respiratory function stable Cardiovascular status: stable Postop Assessment: no headache, no backache and epidural receding Anesthetic complications: no   No notable events documented.  Last Vitals:  Vitals:   01/11/22 0235 01/11/22 0335  BP: 130/89 (!) 144/94  Pulse: 90 80  Resp: 18 18  Temp: 37.4 C 37.4 C  SpO2: 94% 95%    Last Pain:  Vitals:   01/11/22 0335  TempSrc: Oral  PainSc: 0-No pain   Pain Goal:                   Deannah Rossi

## 2022-01-11 NOTE — Lactation Note (Signed)
This note was copied from a baby's chart. Lactation Consultation Note  Patient Name: [name] [name] CVELF'Y Date: 01/11/2022 Reason for consult: Follow-up assessment;Infant < 6lbs;Early term 37-38.6wks;Difficult latch - due to areola edema - will take time with pumping  Age:29 hours Baby has had several attempts latching, EBM with a curved tip syringe and pace fed with a standard nipple / most volume taken at a feeding 4 ml.  For [facility] consult - LC attempted to latch baby and  too sleepy.  Latch 3. LC worked with baby pace feeding and only could get baby to take 4 ml/ tolerated the standard white nipple.  LC checked the flange size #21 was comfortable for mom.  LC discussed the potential feeding behavior of an early term infant.   Maternal Data    Feeding Mother's Current Feeding Choice: Breast Milk and Formula Nipple Type: Nfant Standard Flow (white)  LATCH Score Latch: Too sleepy or reluctant, no latch achieved, no sucking elicited.  Audible Swallowing: None  Type of Nipple: Flat  Comfort (Breast/Nipple): Soft / non-tender  Hold (Positioning): Full assist, staff holds infant at breast  LATCH Score: 3   Lactation Tools Discussed/Used Tools: Pump;Flanges Flange Size: 21 Breast pump type: Double-Electric Breast Pump Pump Education: Setup, frequency, and cleaning;Milk Storage  Interventions Interventions: Breast feeding basics reviewed;Assisted with latch;Skin to skin;Breast massage;Hand express;Adjust position;Support pillows;Position options;Education;DEBP;LC Services brochure;LPT handout/interventions  Discharge Pump: DEBP;Personal  Consult Status Consult Status: Follow-up Date: 01/11/22 Follow-up type: In-patient    Matilde Sprang Skyelar Swigart 01/11/2022, 2:24 PM

## 2022-01-11 NOTE — Progress Notes (Signed)
Subjective: Postpartum Day 0: Cesarean Delivery Patient reports good pain relief.    Objective: Vital signs in last 24 hours: Temp:  [97.6 F (36.4 C)-100.3 F (37.9 C)] 98.1 F (36.7 C) (06/06 0620) Pulse Rate:  [63-147] 76 (06/06 0620) Resp:  [11-20] 18 (06/06 0620) BP: (103-154)/(62-98) 135/97 (06/06 0620) SpO2:  [94 %-100 %] 95 % (06/06 0335)  Physical Exam:  General: alert, cooperative, and no distress Lochia: appropriate Uterine Fundus: firm Incision: healing well DVT Evaluation: No evidence of DVT seen on physical exam.  Recent Labs    01/11/22 0138 01/11/22 0518  HGB 11.1* 12.0  HCT 32.7* 34.0*   Azithromycin less than 1/2 infused  Assessment/Plan: Status post cesarean section Chorioamnionitis Gestational HTN Will continue cefotetan as ordered Repeat BP q2 hours and follow closely Repeat labs today  Roselle Locus II 01/11/2022, 7:18 AM

## 2022-01-11 NOTE — Op Note (Signed)
Cesarean Section Procedure Note  Pre-operative Diagnosis: IUP at 37 weeks, Gestational HTN, fetal intolerance to labor, Chorioamnionitis  Post-operative Diagnosis: same  Surgeon: [name]   Assistants: none  Anesthesia: epidural  Procedure:  Low Segment Transverse cesarean section  Procedure Details  The patient was seen in the Holding Room. The risks, benefits, complications, treatment options, and expected outcomes were discussed with the patient.  The patient concurred with the proposed plan, giving informed consent.  The site of surgery properly noted/marked.. A Time Out was held and the above information confirmed.  After induction of anesthesia, the patient was draped and prepped in the usual sterile manner. A Pfannenstiel incision was made and carried down through the subcutaneous tissue to the fascia. Fascial incision was made and extended transversely. The fascia was separated from the underlying rectus tissue superiorly and inferiorly. The peritoneum was identified and entered. Peritoneal incision was extended longitudinally. The utero-vesical peritoneal reflection was incised transversely and the bladder flap was bluntly freed from the lower uterine segment. A low transverse uterine incision was made. Delivered from vertex presentation was a baby with Apgar scores of 2 at one minute and 8 at five minutes. After the umbilical cord was clamped and cut cord blood was obtained for evaluation. The placenta was removed intact and appeared normal. The uterine outline, tubes and ovaries appeared normal. The uterine incision was closed with running locked sutures of 0 monocryl and imbricated with 0 monocryl. Hemostasis was observed. Lavage was carried out until clear. The peritoneum was then closed with 0 monocryl and rectus muscles plicated in the midline.  After hemostasis was assured, the fascia was then reapproximated with running sutures of 0 Vicryl. Irrigation was applied and after  adequate hemostasis was assured, the skin was reapproximated with subcutaneous sutures using 4-0 monocryl.  Instrument, sponge, and needle counts were correct prior the abdominal closure and at the conclusion of the case. The patient received 2 grams cefotetan preoperatively (previously amp/gent for chorio).  Findings: Viable  female  Estimated Blood Loss:  412cc         Specimens: Placenta was sent to labor and delivery         Complications:  None

## 2022-01-11 NOTE — Lactation Note (Signed)
This note was copied from a baby's chart. Lactation Consultation Note  Patient Name: Boy [place] Mulvaney [id number] Date: 01/11/2022 Reason for consult: Initial assessment;Other (Comment);Infant < 6lbs;Early term 37-38.6wks (attempted to see dyad . Per [facility] - will be getting mom up to the bathroom . LC mentioned she will F/U. MBURN has discussed earlier with LC baby attempted to latch, finger feed 33ml of colostrum. LC plan will include DEBP .) Age:38 hours  Maternal Data    Feeding Mother's Current Feeding Choice: Breast Milk and Formula  LATCH Score - Latch score by the MBURN  Latch: Too sleepy or reluctant, no latch achieved, no sucking elicited.  Audible Swallowing: None  Type of Nipple: Everted at rest and after stimulation  Comfort (Breast/Nipple): Soft / non-tender  Hold (Positioning): Full assist, staff holds infant at breast  LATCH Score: 4   Lactation Tools Discussed/Used    Interventions Interventions: Hand pump;Education;Hand express  Discharge    Consult Status Consult Status: Follow-up Date: 01/11/22 Follow-up type: In-patient    Matilde Sprang Vonzella Althaus 01/11/2022, 11:51 AM

## 2022-01-11 NOTE — Transfer of Care (Signed)
Immediate Anesthesia Transfer of Care Note  [name]  Procedure(s) Performed: CESAREAN SECTION  Patient Location: PACU  Anesthesia Type:Epidural  Level of Consciousness: awake, alert  and oriented  Airway & Oxygen Therapy: Patient Spontanous Breathing  Post-op Assessment: Report given to RN and Post -op Vital signs reviewed and stable  Post vital signs: Reviewed and stable  Last Vitals:  Vitals Value Taken Time  BP 122/63 01/11/22 0100  Temp 36.7 C 01/11/22 0100  Pulse 124 01/11/22 0105  Resp 11 01/11/22 0105  SpO2 98 % 01/11/22 0105  Vitals shown include unvalidated device data.  Last Pain:  Vitals:   01/11/22 0100  TempSrc: Oral  PainSc:          Complications: No notable events documented.

## 2022-01-11 NOTE — Progress Notes (Signed)
No C/O  Today's Vitals   01/11/22 0915 01/11/22 1135 01/11/22 1147 01/11/22 1150  BP: 123/84 130/80 132/86 (!) 127/96  Pulse: 85 75 88 (!) 104  Resp: 16 16    Temp: 98.5 F (36.9 C) 98.1 F (36.7 C)    TempSrc: Oral Oral    SpO2: 99% 98%    Weight:      Height:      PainSc:       Body mass index is 27.96 kg/m.   Results for orders placed or performed during the hospital encounter of 01/10/22 (from the past 24 hour(s))  CBC     Status: Abnormal   Collection Time: 01/11/22  1:38 AM  Result Value Ref Range   WBC 22.7 (H) 4.0 - 10.5 K/uL   RBC 3.70 (L) 3.87 - 5.11 MIL/uL   Hemoglobin 11.1 (L) 12.0 - 15.0 g/dL   HCT 53.6 (L) 46.8 - 03.2 %   MCV 88.4 80.0 - 100.0 fL   MCH 30.0 26.0 - 34.0 pg   MCHC 33.9 30.0 - 36.0 g/dL   RDW 12.2 48.2 - 50.0 %   Platelets 155 150 - 400 K/uL   nRBC 0.0 0.0 - 0.2 %  CBC     Status: Abnormal   Collection Time: 01/11/22  5:18 AM  Result Value Ref Range   WBC 25.5 (H) 4.0 - 10.5 K/uL   RBC 3.90 3.87 - 5.11 MIL/uL   Hemoglobin 12.0 12.0 - 15.0 g/dL   HCT 37.0 (L) 48.8 - 89.1 %   MCV 87.2 80.0 - 100.0 fL   MCH 30.8 26.0 - 34.0 pg   MCHC 35.3 30.0 - 36.0 g/dL   RDW 69.4 50.3 - 88.8 %   Platelets 172 150 - 400 K/uL   nRBC 0.0 0.0 - 0.2 %  CBC     Status: Abnormal   Collection Time: 01/11/22 11:51 AM  Result Value Ref Range   WBC 24.3 (H) 4.0 - 10.5 K/uL   RBC 3.66 (L) 3.87 - 5.11 MIL/uL   Hemoglobin 11.3 (L) 12.0 - 15.0 g/dL   HCT 28.0 (L) 03.4 - 91.7 %   MCV 86.9 80.0 - 100.0 fL   MCH 30.9 26.0 - 34.0 pg   MCHC 35.5 30.0 - 36.0 g/dL   RDW 91.5 05.6 - 97.9 %   Platelets 182 150 - 400 K/uL   nRBC 0.0 0.0 - 0.2 %     A/P: Start labetalol 100mg  TID         D/W patient

## 2022-01-12 LAB — COMPREHENSIVE METABOLIC PANEL
ALT: 18 U/L (ref 0–44)
AST: 21 U/L (ref 15–41)
Albumin: 1.7 g/dL — ABNORMAL LOW (ref 3.5–5.0)
Alkaline Phosphatase: 97 U/L (ref 38–126)
Anion gap: 6 (ref 5–15)
BUN: 10 mg/dL (ref 6–20)
CO2: 23 mmol/L (ref 22–32)
Calcium: 8 mg/dL — ABNORMAL LOW (ref 8.9–10.3)
Chloride: 109 mmol/L (ref 98–111)
Creatinine, Ser: 0.88 mg/dL (ref 0.44–1.00)
GFR, Estimated: >60 mL/min (ref >60)
Glucose, Bld: 72 mg/dL (ref 70–99)
Potassium: 3.6 mmol/L (ref 3.5–5.1)
Sodium: 138 mmol/L (ref 135–145)
Total Bilirubin: 0.3 mg/dL (ref 0.3–1.2)
Total Protein: 4.5 g/dL — ABNORMAL LOW (ref 6.5–8.1)

## 2022-01-12 LAB — CBC
HCT: 28.4 % — ABNORMAL LOW (ref 36.0–46.0)
Hemoglobin: 9.6 g/dL — ABNORMAL LOW (ref 12.0–15.0)
MCH: 29.7 pg (ref 26.0–34.0)
MCHC: 33.8 g/dL (ref 30.0–36.0)
MCV: 87.9 fL (ref 80.0–100.0)
Platelets: 150 10*3/uL (ref 150–400)
RBC: 3.23 MIL/uL — ABNORMAL LOW (ref 3.87–5.11)
RDW: 12.8 % (ref 11.5–15.5)
WBC: 14.2 10*3/uL — ABNORMAL HIGH (ref 4.0–10.5)
nRBC: 0 % (ref 0.0–0.2)

## 2022-01-12 NOTE — Progress Notes (Addendum)
Subjective: Postpartum Day 1: Cesarean Delivery Patient reports tolerating PO, + flatus, and no problems voiding.  Overnight had mild range BP elevations requiring Labetalol 100mg  TID to be started. Denies HA, dizziness, SOB and CP. Pain well controlled.   Objective: Vital signs in last 24 hours: Temp:  [97.5 F (36.4 C)-98.5 F (36.9 C)] 98.1 F (36.7 C) (06/07 0435) Pulse Rate:  [67-104] 70 (06/07 0435) Resp:  [16-18] 18 (06/07 0435) BP: (122-145)/(77-96) 145/84 (06/07 0435) SpO2:  [97 %-100 %] 100 % (06/07 0435)  Physical Exam:  General: alert and cooperative Lochia: appropriate Uterine Fundus: firm Incision: healing well DVT Evaluation: No evidence of DVT seen on physical exam.  Recent Labs    01/11/22 1151 01/12/22 0507  HGB 11.3* 9.6*  HCT 31.8* 28.4*    Assessment/Plan: Status post Cesarean section. Doing well postoperatively.  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 Continue Labetalol 100mg  TID and titrate as needed.  CRT 1.02/0.8, reassuring.  S/p 24 hour abx for chorio and remains AF and w/out s/s of infection.  D/W patient female infant circumcision, risks/benefits reviewed. All questions answered.   03/14/22 01/12/2022, 9:14 AM

## 2022-01-12 NOTE — Social Work (Signed)
CSW acknowledged consult and completed a clinical assessment.  There are no barriers to d/c.  Clinical assessment notes will be entered at a later time.  Kathrin Greathouse, MSW, LCSW [facility] [place] Worker  [phone] 01/12/2022  4:53 PM

## 2022-01-12 NOTE — Lactation Note (Addendum)
This note was copied from a baby's chart. Lactation Consultation Note  Patient Name: [name] [name] [id number] Date: 01/12/2022 Reason for consult: Follow-up assessment;Mother's request;Difficult latch;Primapara;1st time breastfeeding;Early term 37-38.6wks;Breastfeeding assistance;Infant weight loss;Infant < 6lbs Age:29 hours  Mom struggling with increasing volume with formula supplementation. Mother states falls asleep in midst of feeding taking small amounts and wakes wanting more.  LC attempted to feed him with Nfant white nipple too fast, with leakage him sputtering. LC tried SLP purple nipple, he takes in small amounts feeding him in sideline but still struggling with some leakage 5-6 ml then rests comes back wanting more.   Infant fed 21 ml by Mom and RN over period of attempts at 2000. LC on arrival found him still cueing at 2030 over 10 min took 9 ml more in short breaks in between with purple SLP nipple. At times he is pushing nipple out of his mouth, even in sideline.   Mom states did not have any breast changes in size or color during pregnancy.   Infant will latch on to the finger form tight seal.  LC alerted RN, Vicenta Aly. Mom noted infant leakage from Right eye, LC also observed. He appeared jittery during feeding but resolved once he swaddled and went to sleep. LC shared findings with RN, Vicenta Aly  with recommendations her to observe a feeding and consult with Pediatrician for further evaluation with SLP.  Plan 1. To feed based on cues 8-12x 24 hr period. Mom need NS to latch, LC reviewed how to apply it. Mom need assistance with latching for next feeding.  2. Mom aware if infant not latching to offer more with supplementation offering any EBM followed by formula with purple SLP nipple. BF supplementation guide provided keeping total feeding under 30 min.  3 Post pump after latching with DEBP on intial setting 15 min.   LC provided gold SLP nipple to try with next  feeding.    Maternal Data Has patient been taught Hand Expression?: Yes  Feeding Mother's Current Feeding Choice: Breast Milk and Formula Nipple Type: Nfant Slow Flow (purple)  LATCH Score                    Lactation Tools Discussed/Used Tools: Pump;Flanges Flange Size: 21 Breast pump type: Double-Electric Breast Pump Pump Education: Setup, frequency, and cleaning;Milk Storage Reason for Pumping: increase stimulation Pumping frequency: post pumping after latching for 15 min on initial setting  Interventions Interventions: Breast feeding basics reviewed;Hand express;Expressed milk;DEBP;Education;LC Services brochure;Infant Driven Feeding Algorithm education;LPT handout/interventions  Discharge Pump: DEBP  Consult Status Consult Status: Follow-up Date: 01/13/22 Follow-up type: In-patient    [name] Pe  [name] 01/12/2022, 8:59 PM

## 2022-01-13 ENCOUNTER — Encounter (HOSPITAL_COMMUNITY): Payer: Self-pay | Admitting: Obstetrics and Gynecology

## 2022-01-13 MED ORDER — NIFEDIPINE ER 30 MG PO TB24: 30.0000 mg | ORAL_TABLET | Freq: Two times a day (BID) | ORAL | 1 refills | Status: AC

## 2022-01-13 MED ORDER — OXYCODONE-ACETAMINOPHEN 5-325 MG PO TABS: 1.0000 | ORAL_TABLET | ORAL | 0 refills | Status: AC | PRN

## 2022-01-13 MED ORDER — NIFEDIPINE ER OSMOTIC RELEASE 30 MG PO TB24
30.0000 mg | ORAL_TABLET | Freq: Two times a day (BID) | ORAL | Status: DC
  Administered 2022-01-13: 30 mg via ORAL
  Filled 2022-01-13: qty 1

## 2022-01-13 MED ORDER — IBUPROFEN 600 MG PO TABS
600.0000 mg | ORAL_TABLET | Freq: Four times a day (QID) | ORAL | 0 refills | Status: AC | PRN
Start: 2022-01-13 — End: ?

## 2022-01-13 NOTE — Social Work (Signed)
CSW received consult for Edinburgh 12. CSW met with MOB to offer support and complete assessment.    CSW met with MOB at bedside and introduced CSW role. CSW observed MOB in bed holding the infant. MOB presented calm and welcomed CSW visit. CSW inquired how MOB has felt since giving birth. MOB reported, "I want to do it right." She expressed feeling good and nervous. CSW validated MOB emotions and normalized her feeling as a first-time mom. CSW discussed MOB Edinburgh. MOB reported that the Edinburgh reflects how she feels most of time. MOB reported her emotions are not out of the ordinary. MOB reported, "I just tend to overthink things."  CSW inquired about MOB coping strategies. MOB expressed, "I breath, I think it out and then then realize I cannot control everything." CSW acknowledged MOB statement and processed how it will be helpful in caring for herself and the baby. MOB shared that her spouse makes her feel calm, "we just balance each other out.". CSW encouraged MOB to continue to ask for support and help when needed. MOB expressed she is not interested in medication or counseling at this time. MOB was receptive to the resources if concerns arise. CSW provided education regarding the baby blues period vs. perinatal mood disorders, discussed treatment and gave resources for mental health follow up.  CSW discussed PPD. CSW recommended MOB complete a self-evaluation during the postpartum time period using the New Mom Checklist from Postpartum Progress and encouraged MOB to contact a medical professional if symptoms are noted at any time. CSW assessed MOB for safety. MOB reported no thoughts of harm to self and others.   CSW provided review of Sudden Infant Death Syndrome (SIDS) precautions. MOB reported she has items for the baby including a bassinet where the infant will sleep. MOB has chosen [facility] for the infants follow up care.   CSW identifies no further need for intervention and no  barriers to discharge at this time.  Andoni Busch, MSW, LCSW [facility]  Clinical Social Worker  [phone] 01/13/2022  10:05 AM  

## 2022-01-13 NOTE — Progress Notes (Signed)
Patient strongly desires d/c home.  Anti-hypertensive switched from labetalol 100 TID to Procardia 30 XL BID.  BPs improved.  Patient continues to have no symptoms.  Will d/c home with office f/u for BP check tomorrow.       01/13/2022    5:24 PM 01/13/2022    3:00 PM 01/13/2022    1:42 PM  Vitals with BMI  Systolic [id number]  Diastolic 93 89 94  Pulse  62 Kelliher, DO

## 2022-01-13 NOTE — Discharge Instructions (Signed)
Call MD for T>100.4, heavy vaginal bleeding, severe abdominal pain, intractable nausea and/or vomiting, or respiratory distress.  Office will call patient tomorrow to schedule BP check tomorrow.  Pelvic rest x 6 weeks.  No driving while taking narcotics.

## 2022-01-13 NOTE — Addendum Note (Signed)
Addendum  created 01/13/22 1458 by Bethena Midget, MD   Intraprocedure Event deleted, Intraprocedure Event edited

## 2022-01-13 NOTE — Discharge Summary (Signed)
Postpartum Discharge Summary  Patient Name: [name] [name] DOB: Nov 26, 1990 MRN: [id number]  Date of admission: 01/10/2022 Delivery date:01/11/2022  Delivering provider: Louretta Shorten  Date of discharge: 01/13/2022  Admitting diagnosis: Gestational hypertension, third trimester [O13.3] Intrauterine pregnancy: [redacted]w[redacted]d    Secondary diagnosis:  Principal Problem:   Gestational hypertension, third trimester  Additional problems: chorioamnionitis    Discharge diagnosis: Term Pregnancy Delivered and Gestational Hypertension                                              Post partum procedures: none Augmentation: AROM, Pitocin, and Cytotec Complications: None  Hospital course: Induction of Labor With Cesarean Section   31y.o. yo G2P1011 at 3105w3das admitted to the hospital 01/10/2022 for induction of labor. Patient had a labor course significant for non-reassuring fetal status and chorioamnionitis. The patient went for cesarean section due to Non-Reassuring FHR. Delivery details are as follows: Membrane Rupture Time/Date: 7:55 AM ,01/10/2022   Delivery Method:C-Section, Low Transverse  Details of operation can be found in separate operative Note.  Patient had an uncomplicated postpartum course. She is ambulating, tolerating a regular diet, passing flatus, and urinating well.  Patient is discharged home in stable condition on 01/13/22.      Newborn Data: Birth date:01/11/2022  Birth time:12:15 AM  Gender:Female  Living status:Living  Apgars:2 ,8  [id number]                                Magnesium Sulfate received: No BMZ received: No Rhophylac:No MMR:No T-DaP:Given prenatally Flu: No Transfusion:No  Physical exam  Vitals:   01/13/22 0617 01/13/22 1342 01/13/22 1500 01/13/22 1724  BP: (!) 151/91 (!) 163/94 140/89 (!) 139/93  Pulse: 72 68 62   Resp: 18  16   Temp: 97.7 F (36.5 C)  98.2 F (36.8 C)   TempSrc: Oral  Oral   SpO2: 97%  100%   Weight:      Height:       General:  alert, cooperative, and no distress Lochia: appropriate Uterine Fundus: firm Incision: Healing well with no significant drainage DVT Evaluation: No evidence of DVT seen on physical exam. Negative Homan's sign. No cords or calf tenderness. Labs: Lab Results  Component Value Date   WBC 14.2 (H) 01/12/2022   HGB 9.6 (L) 01/12/2022   HCT 28.4 (L) 01/12/2022   MCV 87.9 01/12/2022   PLT 150 01/12/2022      Latest Ref Rng & Units 01/12/2022    5:07 AM  CMP  Glucose 70 - 99 mg/dL 72   BUN 6 - 20 mg/dL 10   Creatinine 0.44 - 1.00 mg/dL 0.88   Sodium 135 - 145 mmol/L 138   Potassium 3.5 - 5.1 mmol/L 3.6   Chloride 98 - 111 mmol/L 109   CO2 22 - 32 mmol/L 23   Calcium 8.9 - 10.3 mg/dL 8.0   Total Protein 6.5 - 8.1 g/dL 4.5   Total Bilirubin 0.3 - 1.2 mg/dL 0.3   Alkaline Phos 38 - 126 U/L 97   AST 15 - 41 U/L 21   ALT 0 - 44 U/L 18    Edinburgh Score:    01/11/2022    7:01 PM  Edinburgh Postnatal Depression Scale Screening Tool  I have been able  to laugh and see the funny side of things. 1  I have looked forward with enjoyment to things. 1  I have blamed myself unnecessarily when things went wrong. 2  I have been anxious or worried for no good reason. 1  I have felt scared or panicky for no good reason. 2  Things have been getting on top of me. 2  I have been so unhappy that I have had difficulty sleeping. 1  I have felt sad or miserable. 1  I have been so unhappy that I have been crying. 1  The thought of harming myself has occurred to me. 0  Edinburgh Postnatal Depression Scale Total 12     After visit meds:  Allergies as of 01/13/2022   No Known Allergies      Medication List     TAKE these medications    HAIR SKIN AND NAILS FORMULA PO Take by mouth daily.   ibuprofen 600 MG tablet Commonly known as: ADVIL Take 1 tablet (600 mg total) by mouth every 6 (six) hours as needed for cramping, moderate pain or mild pain.   NIFEdipine 30 MG 24 hr tablet Commonly  known as: ADALAT CC Take 1 tablet (30 mg total) by mouth 2 (two) times daily.   oxyCODONE-acetaminophen 5-325 MG tablet Commonly known as: PERCOCET/ROXICET Take 1 tablet by mouth every 4 (four) hours as needed for moderate pain.   PrePLUS 27-1 MG Tabs Take by mouth.         Discharge home in stable condition Infant Feeding: Bottle Infant Disposition:home with mother Discharge instruction: per After Visit Summary and Postpartum booklet. Activity: Advance as tolerated. Pelvic rest for 6 weeks.  Diet: routine diet Future Appointments:No future appointments. Follow up Visit: tomorrow for BP check  01/13/2022 Linda Hedges, DO

## 2022-01-13 NOTE — Progress Notes (Signed)
Subjective: Postpartum Day 2: Cesarean Delivery Patient reports incisional pain, tolerating PO, and no problems voiding.  No HA, vision change, RUQ pain, CP/SOB  Objective: Vital signs in last 24 hours: Temp:  [97.7 F (36.5 C)-98.2 F (36.8 C)] 97.7 F (36.5 C) (06/08 0617) Pulse Rate:  [65-89] 72 (06/08 0617) Resp:  [16-18] 18 (06/08 0617) BP: (129-151)/(79-91) 151/91 (06/08 0617) SpO2:  [97 %-100 %] 97 % (06/08 0617)  Physical Exam:  General: alert, cooperative, and appears stated age Lochia: appropriate Uterine Fundus: firm Incision: healing well DVT Evaluation: No evidence of DVT seen on physical exam. Negative Homan's sign. No cords or calf tenderness.  Recent Labs    01/11/22 1151 01/12/22 0507  HGB 11.3* 9.6*  HCT 31.8* 28.4*    Assessment/Plan: Status post Cesarean section. Doing well postoperatively.  Continue current care. GHTN-Labetalol 100 TID.  First BP this AM elevated.  Will monitor to see if change in medication is warranted.  No symptoms.  Labs stable. Possible D/C home this pm pending BP control and baby D/C. Chorio-s/p abx; afebrile  Mitchel Honour 01/13/2022, 9:55 AM

## 2022-01-13 NOTE — Lactation Note (Signed)
This note was copied from a baby's chart. Lactation Consultation Note  Patient Name: [name] [name] [id number] Date: 01/13/2022 Reason for consult: Follow-up assessment;Difficult latch;Primapara;1st time breastfeeding;Early term 37-38.6wks;Breastfeeding assistance Age:29 hours  P1, Early Term, Female Infant, 5.07% WL  LC entered the room and mom was getting ready to go for a walk. Per mom she has decided to feed baby formula. LC spoke with mom about engorgement and breast care.   LC advised mom to use cabbage leaves, avoid stimulation, and to wear a well fitting bra.   LC also encouraged mom to use ice if she become engorged and to hand express only if she needs to for relief.   Mom states that she has no further questions or concerns.    Feeding Mother's Current Feeding Choice: Formula  Interventions Interventions: Education  Discharge Discharge Education: Engorgement and breast care;Warning signs for feeding baby  Consult Status Consult Status: Complete Date: 01/13/22 Follow-up type: Call as needed    [name] 01/13/2022, 5:18 PM

## 2022-01-19 ENCOUNTER — Telehealth (HOSPITAL_COMMUNITY): Payer: Self-pay | Admitting: *Deleted

## 2022-01-19 NOTE — Telephone Encounter (Signed)
Attempted Hospital Discharge Follow-Up Call.  Left voice mail requesting that patient return RN's phone call.  

## 2022-01-25 ENCOUNTER — Encounter (HOSPITAL_COMMUNITY): Payer: Self-pay | Admitting: Obstetrics and Gynecology

## 2023-03-16 IMAGING — US US MFM FETAL BPP W/O NON-STRESS
1 series · 15 of 28 positions shown · non-contrast
Comparison: none

[Series 1: us mfm fetal bpp w/o non-stress · 37 acquisitions, 15 frames shown]
[im 1/37]
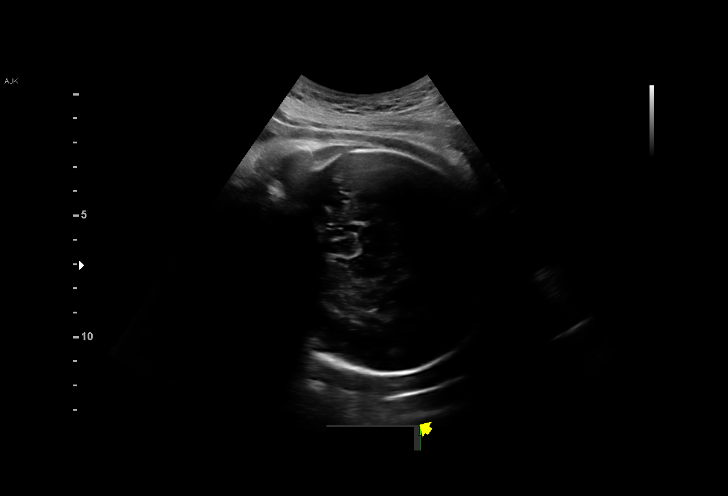
[im 3/37]
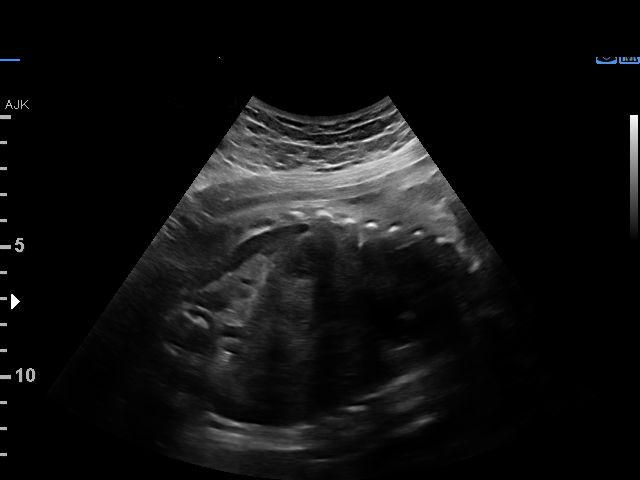
[im 6/37]
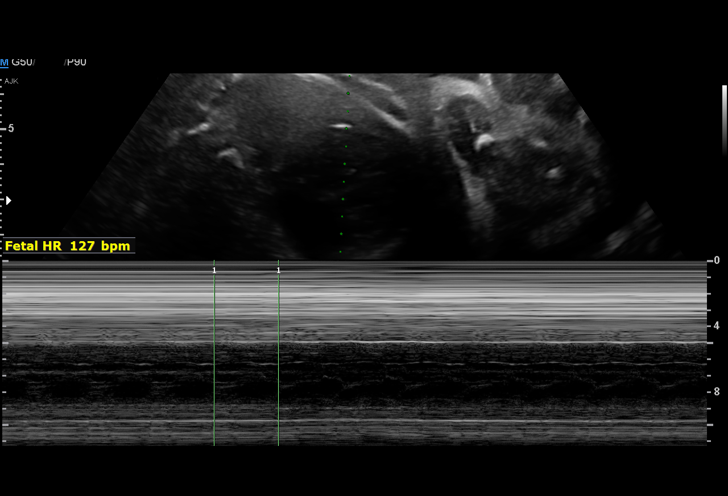
[im 9/37]
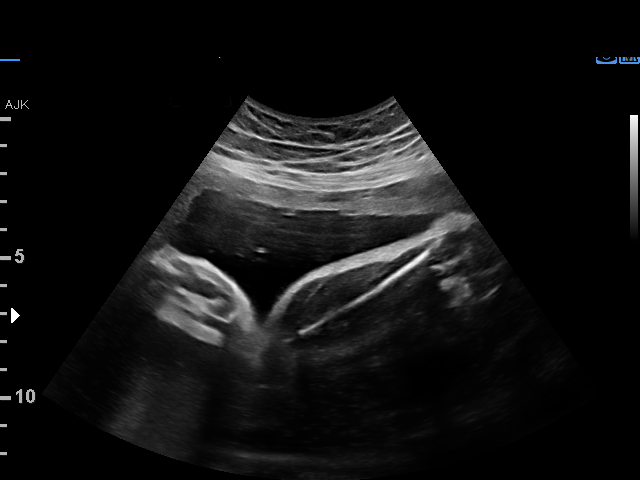
[im 11/37]
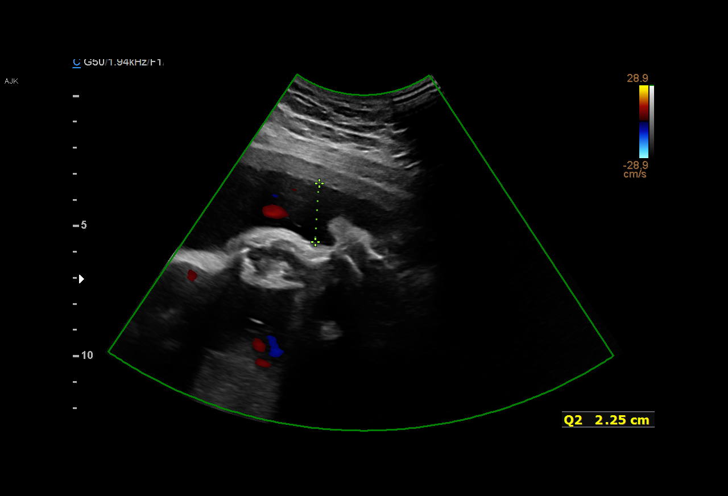
[im 14/37]
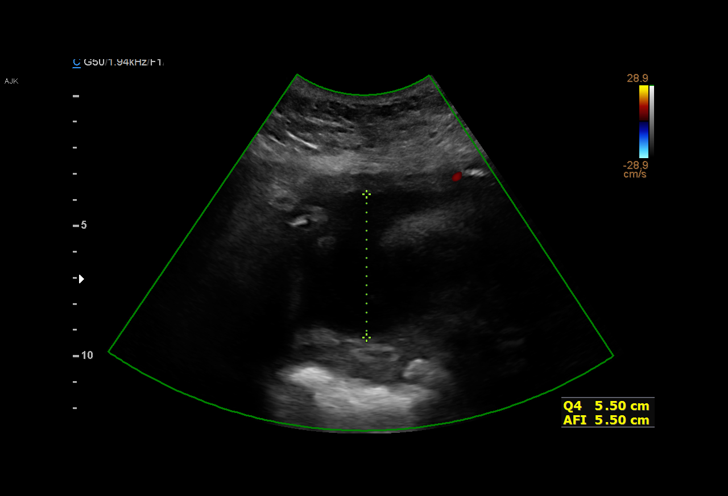
[im 17/37]
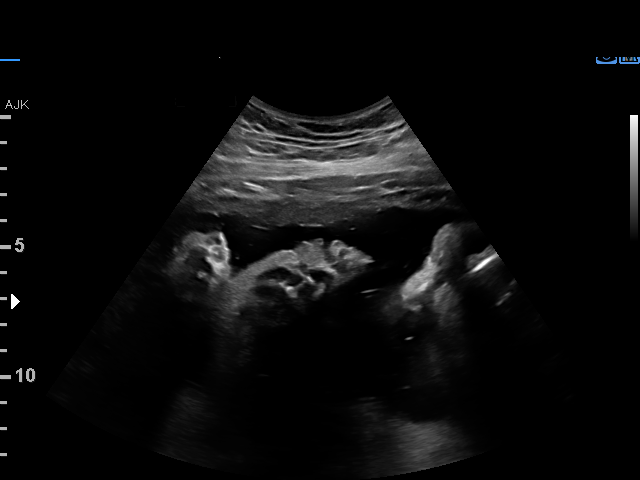
[im 19/37]
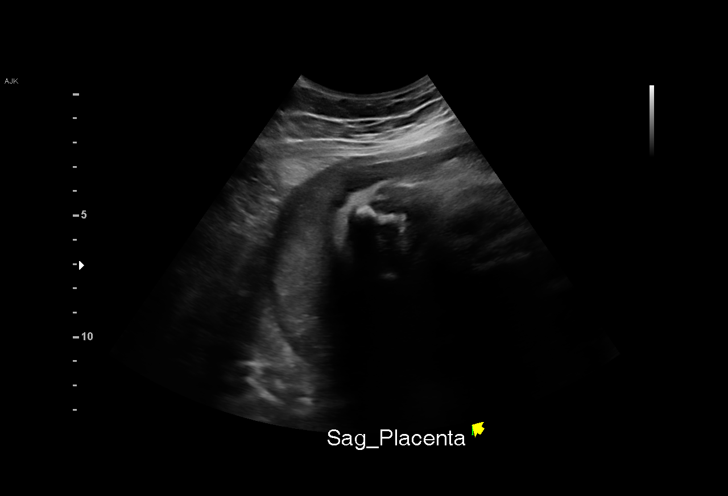
[im 21/37]
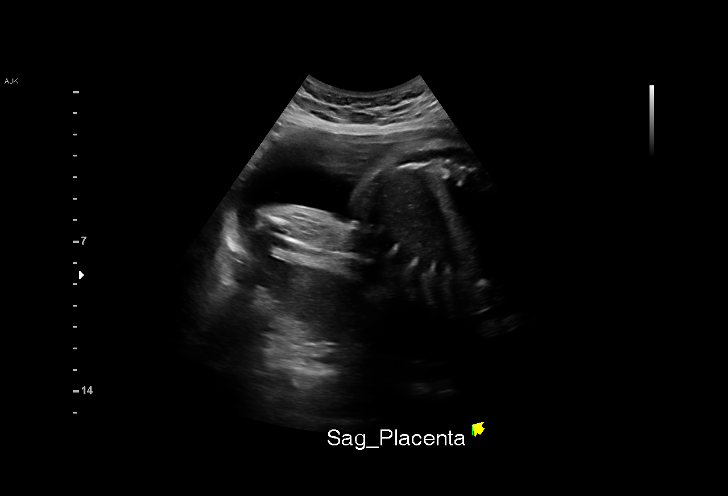
[im 23/37]
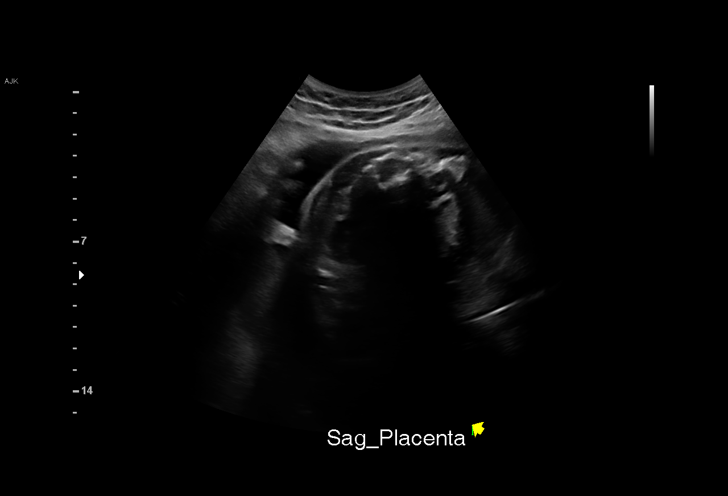
[im 26/37]
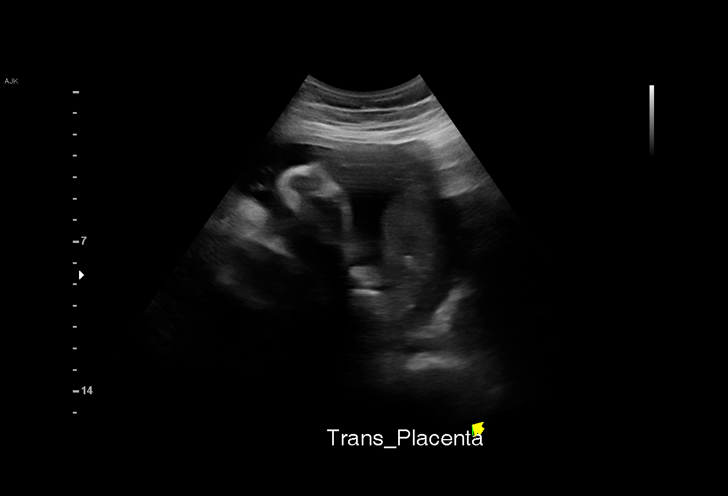
[im 29/37]
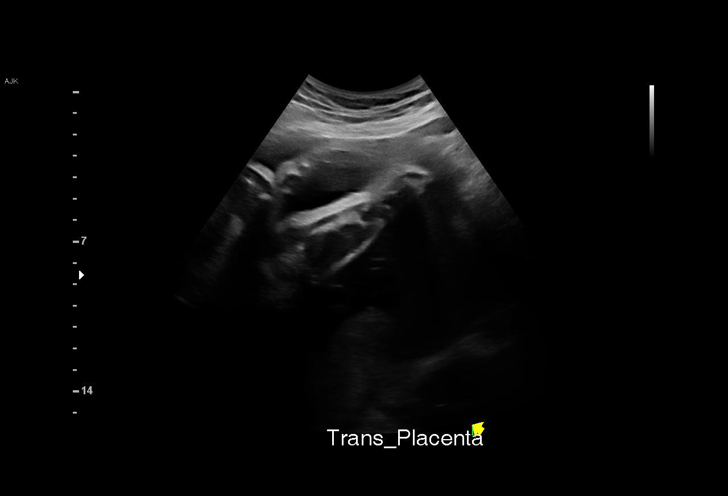
[im 31/37]
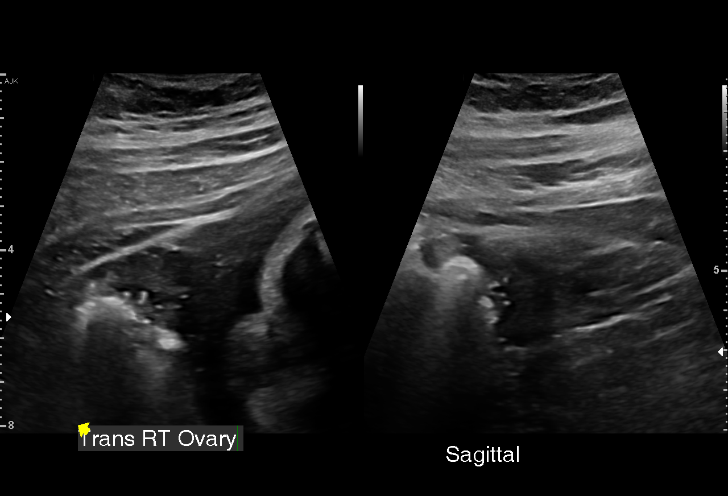
[im 34/37]
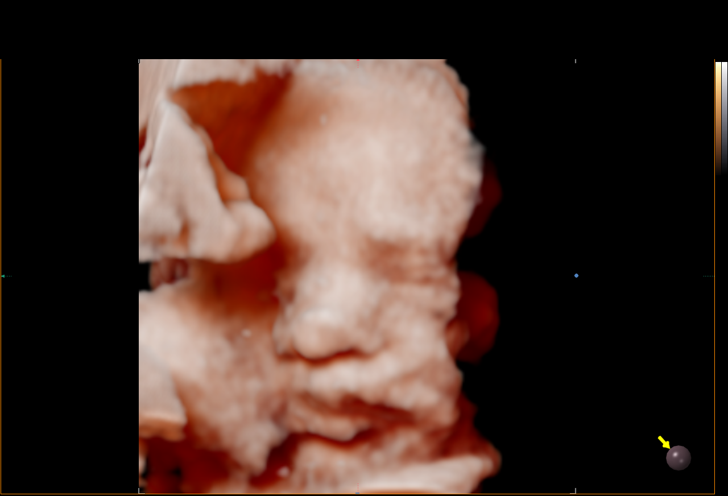
[im 37/37]
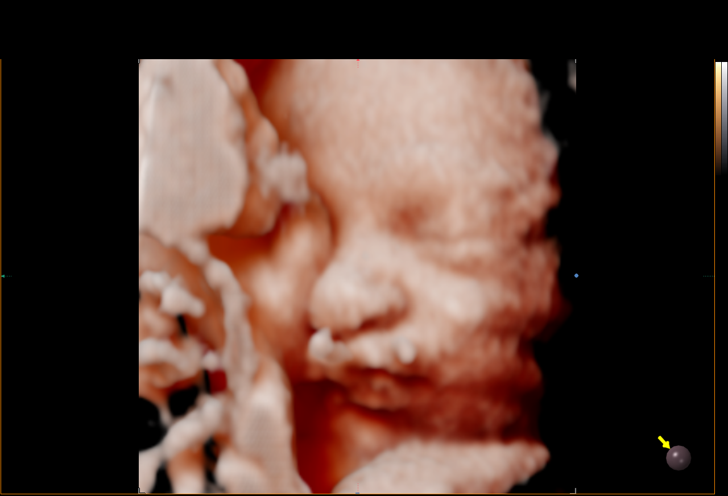

[15 of 28 positions shown; findings below may reference images not displayed]

[REDACTED]

Indications

 34 weeks gestation of pregnancy
 Unspecified preeclampsia, third trimester
 Marginal insertion of umbilical cord affecting
 management of mother in third trimester
 2 vessel umbilical cord
 Echogenic intracardiac focus of the heart
 (EIF)
 Pregnancy resulting from assisted
 reproductive technology
Fetal Evaluation

 Num Of Fetuses:         1
 Fetal Heart Rate(bpm):  127
 Cardiac Activity:       Observed
 Placenta:               Posterior
 P. Cord Insertion:      Marginal insertion prev seen

 Amniotic Fluid
 AFI FV:      Within normal limits

 AFI Sum(cm)     %Tile       Largest Pocket(cm)
 14.57           52

 RUQ(cm)       RLQ(cm)       LUQ(cm)        LLQ(cm)

Biophysical Evaluation
 Amniotic F.V:   Within normal limits       F. Tone:        Observed
 F. Movement:    Observed                   Score:          [DATE]
 F. Breathing:   Observed
OB History

 Gravidity:    2          SAB:   1
 Living:       0
Gestational Age

 Clinical EDD:  34w 5d                                        EDD:   01/29/22
 Best:          34w 5d     Det. By:  Clinical EDD             EDD:   01/29/22
Impression

 Antenatal testing performed given maternal marginal cord
 insertion
 The biophysical profile was [DATE] with good fetal movement and
 amniotic fluid volume.
Recommendations

 Clinical correlation recommended.
